# Patient Record
Sex: Female | Born: 1940 | Race: White | Hispanic: No | Marital: Married | Smoking: Former smoker
Health system: Southern US, Community
[De-identification: ages and names within clinical notes are randomized; demographics above are authoritative.]

## PROBLEM LIST (undated history)

## (undated) DIAGNOSIS — G2581 Restless legs syndrome: Secondary | ICD-10-CM

## (undated) DIAGNOSIS — K759 Inflammatory liver disease, unspecified: Secondary | ICD-10-CM

## (undated) DIAGNOSIS — R112 Nausea with vomiting, unspecified: Secondary | ICD-10-CM

## (undated) DIAGNOSIS — E039 Hypothyroidism, unspecified: Secondary | ICD-10-CM

## (undated) DIAGNOSIS — Z8639 Personal history of other endocrine, nutritional and metabolic disease: Secondary | ICD-10-CM

## (undated) DIAGNOSIS — C4491 Basal cell carcinoma of skin, unspecified: Secondary | ICD-10-CM

## (undated) DIAGNOSIS — J189 Pneumonia, unspecified organism: Secondary | ICD-10-CM

## (undated) DIAGNOSIS — E7800 Pure hypercholesterolemia, unspecified: Secondary | ICD-10-CM

## (undated) DIAGNOSIS — M5030 Other cervical disc degeneration, unspecified cervical region: Secondary | ICD-10-CM

## (undated) DIAGNOSIS — Z923 Personal history of irradiation: Secondary | ICD-10-CM

## (undated) DIAGNOSIS — M1990 Unspecified osteoarthritis, unspecified site: Secondary | ICD-10-CM

## (undated) DIAGNOSIS — R000 Tachycardia, unspecified: Secondary | ICD-10-CM

## (undated) DIAGNOSIS — Z8719 Personal history of other diseases of the digestive system: Secondary | ICD-10-CM

## (undated) DIAGNOSIS — K589 Irritable bowel syndrome without diarrhea: Secondary | ICD-10-CM

## (undated) DIAGNOSIS — I10 Essential (primary) hypertension: Secondary | ICD-10-CM

## (undated) DIAGNOSIS — Z862 Personal history of diseases of the blood and blood-forming organs and certain disorders involving the immune mechanism: Secondary | ICD-10-CM

## (undated) DIAGNOSIS — K219 Gastro-esophageal reflux disease without esophagitis: Secondary | ICD-10-CM

## (undated) DIAGNOSIS — F411 Generalized anxiety disorder: Secondary | ICD-10-CM

## (undated) DIAGNOSIS — Z9889 Other specified postprocedural states: Secondary | ICD-10-CM

## (undated) DIAGNOSIS — H269 Unspecified cataract: Secondary | ICD-10-CM

## (undated) HISTORY — PX: BREAST LUMPECTOMY: SHX2

## (undated) HISTORY — PX: UPPER GI ENDOSCOPY: SHX6162

## (undated) HISTORY — PX: TOTAL HIP ARTHROPLASTY: SHX124

## (undated) HISTORY — PX: CARPAL TUNNEL RELEASE: SHX101

## (undated) HISTORY — PX: COLONOSCOPY: SHX174

## (undated) HISTORY — PX: APPENDECTOMY: SHX54

## (undated) HISTORY — PX: INGUINAL HERNIA REPAIR: SUR1180

## (undated) HISTORY — PX: TOTAL KNEE ARTHROPLASTY: SHX125

---
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
## 1986-11-11 HISTORY — PX: ABDOMINAL HYSTERECTOMY: SHX81

## 2009-11-11 HISTORY — PX: BLADDER SUSPENSION: SHX72

## 2010-11-11 HISTORY — PX: JOINT REPLACEMENT: SHX530

## 2012-12-25 ENCOUNTER — Encounter (HOSPITAL_COMMUNITY): Payer: Self-pay | Admitting: Pharmacy Technician

## 2012-12-31 ENCOUNTER — Encounter (HOSPITAL_COMMUNITY)
Admission: RE | Admit: 2012-12-31 | Discharge: 2012-12-31 | Disposition: A | Discharge status: Home / Self Care | Payer: Medicare Other | Source: Ambulatory Visit | Attending: Orthopedic Surgery | Admitting: Orthopedic Surgery

## 2012-12-31 ENCOUNTER — Ambulatory Visit (HOSPITAL_COMMUNITY)
Admission: RE | Admit: 2012-12-31 | Discharge: 2012-12-31 | Disposition: A | Discharge status: Home / Self Care | Payer: Medicare Other | Source: Ambulatory Visit | Attending: Orthopedic Surgery | Admitting: Orthopedic Surgery

## 2012-12-31 ENCOUNTER — Encounter (HOSPITAL_COMMUNITY): Payer: Self-pay

## 2012-12-31 ENCOUNTER — Other Ambulatory Visit: Payer: Self-pay | Admitting: Orthopedic Surgery

## 2012-12-31 DIAGNOSIS — Z01818 Encounter for other preprocedural examination: Secondary | ICD-10-CM | POA: Insufficient documentation

## 2012-12-31 DIAGNOSIS — R9431 Abnormal electrocardiogram [ECG] [EKG]: Secondary | ICD-10-CM | POA: Insufficient documentation

## 2012-12-31 DIAGNOSIS — Z01810 Encounter for preprocedural cardiovascular examination: Secondary | ICD-10-CM | POA: Insufficient documentation

## 2012-12-31 DIAGNOSIS — Z01812 Encounter for preprocedural laboratory examination: Secondary | ICD-10-CM | POA: Insufficient documentation

## 2012-12-31 HISTORY — DX: Essential (primary) hypertension: I10

## 2012-12-31 HISTORY — DX: Hypothyroidism, unspecified: E03.9

## 2012-12-31 HISTORY — DX: Unspecified osteoarthritis, unspecified site: M19.90

## 2012-12-31 LAB — ABO/RH: ABO/RH(D): O POS

## 2012-12-31 LAB — CBC WITH DIFFERENTIAL/PLATELET
Basophils Absolute: 0 10*3/uL (ref 0.0–0.1)
Basophils Relative: 1 % (ref 0–1)
Eosinophils Absolute: 0 10*3/uL (ref 0.0–0.7)
Eosinophils Relative: 1 % (ref 0–5)
Lymphs Abs: 1.1 10*3/uL (ref 0.7–4.0)
MCH: 29.6 pg (ref 26.0–34.0)
MCHC: 33.7 g/dL (ref 30.0–36.0)
MCV: 88 fL (ref 78.0–100.0)
Neutrophils Relative %: 61 % (ref 43–77)
Platelets: 218 10*3/uL (ref 150–400)
RDW: 12.3 % (ref 11.5–15.5)

## 2012-12-31 LAB — URINALYSIS, ROUTINE W REFLEX MICROSCOPIC
Glucose, UA: NEGATIVE mg/dL
Leukocytes, UA: NEGATIVE
Protein, ur: NEGATIVE mg/dL
Specific Gravity, Urine: 1.023 (ref 1.005–1.030)
Urobilinogen, UA: 0.2 mg/dL (ref 0.0–1.0)

## 2012-12-31 LAB — TYPE AND SCREEN
ABO/RH(D): O POS
Antibody Screen: NEGATIVE

## 2012-12-31 LAB — URINE MICROSCOPIC-ADD ON

## 2012-12-31 LAB — COMPREHENSIVE METABOLIC PANEL
ALT: 14 U/L (ref 0–35)
Albumin: 3.7 g/dL (ref 3.5–5.2)
Calcium: 9.7 mg/dL (ref 8.4–10.5)
GFR calc Af Amer: >90 mL/min (ref >90)
Glucose, Bld: 87 mg/dL (ref 70–99)
Sodium: 139 mEq/L (ref 135–145)
Total Protein: 7 g/dL (ref 6.0–8.3)

## 2012-12-31 LAB — SURGICAL PCR SCREEN
MRSA, PCR: NEGATIVE
Staphylococcus aureus: NEGATIVE

## 2012-12-31 MED ORDER — CHLORHEXIDINE GLUCONATE 4 % EX LIQD: 60.0000 mL | Freq: Once | CUTANEOUS | Status: DC

## 2012-12-31 NOTE — Pre-Procedure Instructions (Signed)
Anne Pearson  12/31/2012   Your procedure is scheduled on:  Monday January 04, 2013  Report to [facility] at 0530 AM.  Call this number if you have problems the morning of surgery: [phone]   Remember:   Do not eat food or drink liquids after midnight.Sunday   Take these medicines the morning of surgery with A SIP OF WATER: Alprazolam (Xanax) if needed, Synthroid (Levothyroxine), Loratadine (Claritin), Tramadol ( Ultram ) if needed. Bring nasal spray.   Do not wear jewelry, make-up or nail polish.  Do not wear lotions, powders, or perfumes. You may wear deodorant.  Do not shave 48 hours prior to surgery.   Do not bring valuables to the hospital.  Contacts, dentures or bridgework may not be worn into surgery.  Leave suitcase in the car. After surgery it may be brought to your room.  For patients admitted to the hospital, checkout time is 11:00 AM the day of  discharge.   Patients discharged the day of surgery will not be allowed to drive  home.    Special Instructions: Incentive Spirometry - Practice and bring it with you on the day of surgery. Shower using CHG 2 nights before surgery and the night before surgery.  If you shower the day of surgery use CHG.  Use special wash - you have one bottle of CHG for all showers.  You should use approximately 1/3 of the bottle for each shower.   Please read over the following fact sheets that you were given: Pain Booklet, Coughing and Deep Breathing, Blood Transfusion Information, MRSA Information and Surgical Site Infection Prevention

## 2013-01-01 LAB — URINE CULTURE

## 2013-01-03 MED ORDER — CEFAZOLIN SODIUM-DEXTROSE 2-3 GM-% IV SOLR
2.0000 g | INTRAVENOUS | Status: AC
  Administered 2013-01-04: 2 g via INTRAVENOUS
  Filled 2013-01-03: qty 50

## 2013-01-04 ENCOUNTER — Inpatient Hospital Stay (HOSPITAL_COMMUNITY): Payer: Medicare Other | Admitting: Anesthesiology

## 2013-01-04 ENCOUNTER — Inpatient Hospital Stay (HOSPITAL_COMMUNITY)
Admission: RE | Admit: 2013-01-04 | Discharge: 2013-01-06 | DRG: 470 | Disposition: A | Discharge status: Home Health | Payer: Medicare Other | Source: Ambulatory Visit | Attending: Orthopedic Surgery | Admitting: Orthopedic Surgery

## 2013-01-04 ENCOUNTER — Encounter (HOSPITAL_COMMUNITY): Payer: Self-pay | Admitting: *Deleted

## 2013-01-04 ENCOUNTER — Encounter (HOSPITAL_COMMUNITY): Payer: Self-pay | Admitting: Anesthesiology

## 2013-01-04 ENCOUNTER — Inpatient Hospital Stay (HOSPITAL_COMMUNITY): Payer: Medicare Other

## 2013-01-04 ENCOUNTER — Encounter (HOSPITAL_COMMUNITY)
Admission: RE | Disposition: A | Discharge status: Home Health | Payer: Self-pay | Source: Ambulatory Visit | Attending: Orthopedic Surgery

## 2013-01-04 DIAGNOSIS — Z87891 Personal history of nicotine dependence: Secondary | ICD-10-CM

## 2013-01-04 DIAGNOSIS — D62 Acute posthemorrhagic anemia: Secondary | ICD-10-CM | POA: Diagnosis not present

## 2013-01-04 DIAGNOSIS — I10 Essential (primary) hypertension: Secondary | ICD-10-CM | POA: Diagnosis present

## 2013-01-04 DIAGNOSIS — E039 Hypothyroidism, unspecified: Secondary | ICD-10-CM | POA: Diagnosis present

## 2013-01-04 DIAGNOSIS — M1710 Unilateral primary osteoarthritis, unspecified knee: Principal | ICD-10-CM | POA: Diagnosis present

## 2013-01-04 HISTORY — PX: PARTIAL KNEE ARTHROPLASTY: SHX2174

## 2013-01-04 LAB — CBC
MCH: 29.9 pg (ref 26.0–34.0)
MCV: 87.4 fL (ref 78.0–100.0)
Platelets: 184 10*3/uL (ref 150–400)
RBC: 4.28 MIL/uL (ref 3.87–5.11)
RDW: 12.2 % (ref 11.5–15.5)

## 2013-01-04 LAB — CREATININE, SERUM: Creatinine, Ser: 0.78 mg/dL (ref 0.50–1.10)

## 2013-01-04 SURGERY — ARTHROPLASTY, KNEE, UNICOMPARTMENTAL
Anesthesia: Regional | Site: Knee | Laterality: Right | Wound class: Clean

## 2013-01-04 MED ORDER — METOCLOPRAMIDE HCL 10 MG PO TABS: 5.0000 mg | ORAL_TABLET | Freq: Three times a day (TID) | ORAL | Status: DC | PRN

## 2013-01-04 MED ORDER — HYDROMORPHONE HCL PF 1 MG/ML IJ SOLN
INTRAMUSCULAR | Status: AC
  Filled 2013-01-04: qty 1

## 2013-01-04 MED ORDER — BISACODYL 5 MG PO TBEC: 5.0000 mg | DELAYED_RELEASE_TABLET | Freq: Every day | ORAL | Status: DC | PRN

## 2013-01-04 MED ORDER — OXYCODONE HCL 5 MG PO TABS
5.0000 mg | ORAL_TABLET | ORAL | Status: DC | PRN
  Administered 2013-01-04: 5 mg via ORAL
  Administered 2013-01-05 – 2013-01-06 (×5): 10 mg via ORAL
  Filled 2013-01-04 (×6): qty 2
  Filled 2013-01-04: qty 1

## 2013-01-04 MED ORDER — ONDANSETRON HCL 4 MG/2ML IJ SOLN: 4.0000 mg | Freq: Once | INTRAMUSCULAR | Status: DC | PRN

## 2013-01-04 MED ORDER — ACETAMINOPHEN 10 MG/ML IV SOLN
INTRAVENOUS | Status: AC
  Filled 2013-01-04: qty 100

## 2013-01-04 MED ORDER — GLYCOPYRROLATE 0.2 MG/ML IJ SOLN
INTRAMUSCULAR | Status: DC | PRN
  Administered 2013-01-04: 0.2 mg via INTRAVENOUS

## 2013-01-04 MED ORDER — SODIUM CHLORIDE 0.9 % IV SOLN: INTRAVENOUS | Status: DC

## 2013-01-04 MED ORDER — LACTATED RINGERS IV SOLN
INTRAVENOUS | Status: DC | PRN
  Administered 2013-01-04 (×2): via INTRAVENOUS

## 2013-01-04 MED ORDER — FLUTICASONE PROPIONATE 50 MCG/ACT NA SUSP
2.0000 | Freq: Every day | NASAL | Status: DC
  Administered 2013-01-04 – 2013-01-06 (×3): 2 via NASAL
  Filled 2013-01-04: qty 16

## 2013-01-04 MED ORDER — HYDROMORPHONE HCL PF 1 MG/ML IJ SOLN: 0.5000 mg | INTRAMUSCULAR | Status: DC | PRN

## 2013-01-04 MED ORDER — METHOCARBAMOL 500 MG PO TABS
ORAL_TABLET | ORAL | Status: AC
  Filled 2013-01-04: qty 1

## 2013-01-04 MED ORDER — HYDROMORPHONE HCL PF 1 MG/ML IJ SOLN
0.2500 mg | INTRAMUSCULAR | Status: DC | PRN
  Administered 2013-01-04 (×2): 0.5 mg via INTRAVENOUS

## 2013-01-04 MED ORDER — FENTANYL CITRATE 0.05 MG/ML IJ SOLN
INTRAMUSCULAR | Status: DC | PRN
  Administered 2013-01-04 (×4): 50 ug via INTRAVENOUS

## 2013-01-04 MED ORDER — DOCUSATE SODIUM 100 MG PO CAPS
100.0000 mg | ORAL_CAPSULE | Freq: Two times a day (BID) | ORAL | Status: DC
  Administered 2013-01-04 – 2013-01-06 (×5): 100 mg via ORAL
  Filled 2013-01-04 (×5): qty 1

## 2013-01-04 MED ORDER — DEXTROSE 5 % IV SOLN
INTRAVENOUS | Status: DC | PRN
  Administered 2013-01-04 (×2): via INTRAVENOUS

## 2013-01-04 MED ORDER — ALUM & MAG HYDROXIDE-SIMETH 200-200-20 MG/5ML PO SUSP: 30.0000 mL | ORAL | Status: DC | PRN

## 2013-01-04 MED ORDER — SENNOSIDES-DOCUSATE SODIUM 8.6-50 MG PO TABS: 1.0000 | ORAL_TABLET | Freq: Every evening | ORAL | Status: DC | PRN

## 2013-01-04 MED ORDER — ACETAMINOPHEN 650 MG RE SUPP: 650.0000 mg | Freq: Four times a day (QID) | RECTAL | Status: DC | PRN

## 2013-01-04 MED ORDER — PROPOFOL 10 MG/ML IV EMUL
INTRAVENOUS | Status: DC | PRN
  Administered 2013-01-04: 200 mL via INTRAVENOUS

## 2013-01-04 MED ORDER — MIDAZOLAM HCL 5 MG/5ML IJ SOLN
INTRAMUSCULAR | Status: DC | PRN
  Administered 2013-01-04 (×2): 1 mg via INTRAVENOUS

## 2013-01-04 MED ORDER — BUPIVACAINE-EPINEPHRINE 0.25% -1:200000 IJ SOLN
INTRAMUSCULAR | Status: AC
  Filled 2013-01-04: qty 1

## 2013-01-04 MED ORDER — ALPRAZOLAM 0.5 MG PO TABS
0.5000 mg | ORAL_TABLET | Freq: Every day | ORAL | Status: DC
  Administered 2013-01-04 – 2013-01-05 (×2): 0.5 mg via ORAL
  Filled 2013-01-04 (×2): qty 1

## 2013-01-04 MED ORDER — FLEET ENEMA 7-19 GM/118ML RE ENEM: 1.0000 | ENEMA | Freq: Once | RECTAL | Status: AC | PRN

## 2013-01-04 MED ORDER — DEXAMETHASONE SODIUM PHOSPHATE 10 MG/ML IJ SOLN
INTRAMUSCULAR | Status: DC | PRN
  Administered 2013-01-04: 8 mg via INTRAVENOUS

## 2013-01-04 MED ORDER — LIDOCAINE HCL (CARDIAC) 20 MG/ML IV SOLN
INTRAVENOUS | Status: DC | PRN
  Administered 2013-01-04: 100 mg via INTRAVENOUS

## 2013-01-04 MED ORDER — LEVOTHYROXINE SODIUM 112 MCG PO TABS
112.0000 ug | ORAL_TABLET | Freq: Every day | ORAL | Status: DC
  Administered 2013-01-05 – 2013-01-06 (×2): 112 ug via ORAL
  Filled 2013-01-04 (×3): qty 1

## 2013-01-04 MED ORDER — CHLORHEXIDINE GLUCONATE 4 % EX LIQD: 60.0000 mL | Freq: Once | CUTANEOUS | Status: DC

## 2013-01-04 MED ORDER — ACETAMINOPHEN 325 MG PO TABS: 650.0000 mg | ORAL_TABLET | Freq: Four times a day (QID) | ORAL | Status: DC | PRN

## 2013-01-04 MED ORDER — METHOCARBAMOL 500 MG PO TABS
500.0000 mg | ORAL_TABLET | Freq: Four times a day (QID) | ORAL | Status: DC | PRN
  Administered 2013-01-04 – 2013-01-06 (×4): 500 mg via ORAL
  Filled 2013-01-04 (×4): qty 1

## 2013-01-04 MED ORDER — ZOLPIDEM TARTRATE 5 MG PO TABS: 5.0000 mg | ORAL_TABLET | Freq: Every evening | ORAL | Status: DC | PRN

## 2013-01-04 MED ORDER — ONDANSETRON HCL 4 MG/2ML IJ SOLN: 4.0000 mg | Freq: Four times a day (QID) | INTRAMUSCULAR | Status: DC | PRN

## 2013-01-04 MED ORDER — SODIUM CHLORIDE 0.9 % IV SOLN
INTRAVENOUS | Status: DC
  Administered 2013-01-04: 10:00:00 via INTRAVENOUS

## 2013-01-04 MED ORDER — METOCLOPRAMIDE HCL 5 MG/ML IJ SOLN: 5.0000 mg | Freq: Three times a day (TID) | INTRAMUSCULAR | Status: DC | PRN

## 2013-01-04 MED ORDER — ACETAMINOPHEN 10 MG/ML IV SOLN
1000.0000 mg | Freq: Four times a day (QID) | INTRAVENOUS | Status: AC
  Administered 2013-01-04 – 2013-01-05 (×3): 1000 mg via INTRAVENOUS
  Filled 2013-01-04 (×5): qty 100

## 2013-01-04 MED ORDER — CELECOXIB 200 MG PO CAPS
200.0000 mg | ORAL_CAPSULE | Freq: Two times a day (BID) | ORAL | Status: DC
  Administered 2013-01-04 – 2013-01-06 (×5): 200 mg via ORAL
  Filled 2013-01-04 (×6): qty 1

## 2013-01-04 MED ORDER — CEFAZOLIN SODIUM 1-5 GM-% IV SOLN
1.0000 g | Freq: Four times a day (QID) | INTRAVENOUS | Status: AC
  Administered 2013-01-04 (×2): 1 g via INTRAVENOUS
  Filled 2013-01-04 (×3): qty 50

## 2013-01-04 MED ORDER — METHOCARBAMOL 100 MG/ML IJ SOLN: 500.0000 mg | Freq: Four times a day (QID) | INTRAVENOUS | Status: DC | PRN

## 2013-01-04 MED ORDER — PHENOL 1.4 % MT LIQD: 1.0000 | OROMUCOSAL | Status: DC | PRN

## 2013-01-04 MED ORDER — ONDANSETRON HCL 4 MG PO TABS: 4.0000 mg | ORAL_TABLET | Freq: Four times a day (QID) | ORAL | Status: DC | PRN

## 2013-01-04 MED ORDER — DIPHENHYDRAMINE HCL 12.5 MG/5ML PO ELIX: 12.5000 mg | ORAL_SOLUTION | ORAL | Status: DC | PRN

## 2013-01-04 MED ORDER — MENTHOL 3 MG MT LOZG: 1.0000 | LOZENGE | OROMUCOSAL | Status: DC | PRN

## 2013-01-04 MED ORDER — BUPIVACAINE-EPINEPHRINE 0.25% -1:200000 IJ SOLN
INTRAMUSCULAR | Status: DC | PRN
  Administered 2013-01-04: 20 mL

## 2013-01-04 MED ORDER — LISINOPRIL 10 MG PO TABS
10.0000 mg | ORAL_TABLET | Freq: Every day | ORAL | Status: DC
  Administered 2013-01-05 – 2013-01-06 (×2): 10 mg via ORAL
  Filled 2013-01-04 (×2): qty 1

## 2013-01-04 MED ORDER — ACETAMINOPHEN 10 MG/ML IV SOLN
1000.0000 mg | Freq: Four times a day (QID) | INTRAVENOUS | Status: DC
  Administered 2013-01-04: 1000 mg via INTRAVENOUS

## 2013-01-04 MED ORDER — ENOXAPARIN SODIUM 30 MG/0.3ML ~~LOC~~ SOLN
30.0000 mg | Freq: Two times a day (BID) | SUBCUTANEOUS | Status: DC
  Administered 2013-01-05 – 2013-01-06 (×3): 30 mg via SUBCUTANEOUS
  Filled 2013-01-04 (×5): qty 0.3

## 2013-01-04 MED ORDER — LORATADINE 10 MG PO TABS
10.0000 mg | ORAL_TABLET | Freq: Every day | ORAL | Status: DC
  Administered 2013-01-04 – 2013-01-06 (×3): 10 mg via ORAL
  Filled 2013-01-04 (×3): qty 1

## 2013-01-04 MED ORDER — ONDANSETRON HCL 4 MG/2ML IJ SOLN
INTRAMUSCULAR | Status: DC | PRN
  Administered 2013-01-04: 4 mg via INTRAVENOUS

## 2013-01-04 MED ORDER — PHENYLEPHRINE HCL 10 MG/ML IJ SOLN
INTRAMUSCULAR | Status: DC | PRN
  Administered 2013-01-04: 40 ug via INTRAVENOUS
  Administered 2013-01-04: 120 ug via INTRAVENOUS

## 2013-01-04 SURGICAL SUPPLY — 61 items
BANDAGE ESMARK 6X9 LF (GAUZE/BANDAGES/DRESSINGS) ×1 IMPLANT
BLADE SAW RECIP 87.9 MT (BLADE) ×2 IMPLANT
BLADE SAW SGTL 13X75X1.27 (BLADE) ×2 IMPLANT
BLADE SAW SGTL 83.5X18.5 (BLADE) ×2 IMPLANT
BNDG ELASTIC 6X10 VLCR STRL LF (GAUZE/BANDAGES/DRESSINGS) ×2 IMPLANT
BNDG ESMARK 6X9 LF (GAUZE/BANDAGES/DRESSINGS) ×2
BOWL SMART MIX CTS (DISPOSABLE) ×2 IMPLANT
CEMENT BONE SIMPLEX SPEEDSET (Cement) ×2 IMPLANT
CLOTH BEACON ORANGE TIMEOUT ST (SAFETY) ×2 IMPLANT
COTTONBALL LRG STERILE PKG (GAUZE/BANDAGES/DRESSINGS) ×2 IMPLANT
COVER BACK TABLE 24X17X13 BIG (DRAPES) IMPLANT
COVER SURGICAL LIGHT HANDLE (MISCELLANEOUS) ×4 IMPLANT
CUFF TOURNIQUET SINGLE 34IN LL (TOURNIQUET CUFF) ×2 IMPLANT
DRAPE C-ARM 42X72 X-RAY (DRAPES) ×2 IMPLANT
DRAPE EXTREMITY T 121X128X90 (DRAPE) ×2 IMPLANT
DRAPE INCISE IOBAN 66X45 STRL (DRAPES) ×6 IMPLANT
DRAPE PROXIMA HALF (DRAPES) ×2 IMPLANT
DRAPE U-SHAPE 47X51 STRL (DRAPES) ×2 IMPLANT
DRSG ADAPTIC 3X8 NADH LF (GAUZE/BANDAGES/DRESSINGS) ×2 IMPLANT
DRSG PAD ABDOMINAL 8X10 ST (GAUZE/BANDAGES/DRESSINGS) ×2 IMPLANT
DURAPREP 26ML APPLICATOR (WOUND CARE) ×4 IMPLANT
ELECT REM PT RETURN 9FT ADLT (ELECTROSURGICAL) ×2
ELECTRODE REM PT RTRN 9FT ADLT (ELECTROSURGICAL) ×1 IMPLANT
EVACUATOR 1/8 PVC DRAIN (DRAIN) ×2 IMPLANT
FLUID NSS /IRRIG 3000 ML XXX (IV SOLUTION) ×2 IMPLANT
GLOVE BIOGEL M 7.0 STRL (GLOVE) ×2 IMPLANT
GLOVE BIOGEL PI IND STRL 7.5 (GLOVE) ×1 IMPLANT
GLOVE BIOGEL PI IND STRL 8.5 (GLOVE) ×2 IMPLANT
GLOVE BIOGEL PI INDICATOR 7.5 (GLOVE) ×1
GLOVE BIOGEL PI INDICATOR 8.5 (GLOVE) ×2
GLOVE SURG ORTHO 8.0 STRL STRW (GLOVE) ×4 IMPLANT
GOWN PREVENTION PLUS XLARGE (GOWN DISPOSABLE) ×4 IMPLANT
GOWN STRL NON-REIN LRG LVL3 (GOWN DISPOSABLE) ×6 IMPLANT
HANDPIECE INTERPULSE COAX TIP (DISPOSABLE) ×1
HOOD PEEL AWAY FACE SHEILD DIS (HOOD) ×6 IMPLANT
KIT BASIN OR (CUSTOM PROCEDURE TRAY) ×2 IMPLANT
KIT ROOM TURNOVER OR (KITS) ×2 IMPLANT
MANIFOLD NEPTUNE II (INSTRUMENTS) ×2 IMPLANT
NEEDLE 22X1 1/2 (OR ONLY) (NEEDLE) ×2 IMPLANT
NS IRRIG 1000ML POUR BTL (IV SOLUTION) ×2 IMPLANT
PACK TOTAL JOINT (CUSTOM PROCEDURE TRAY) ×2 IMPLANT
PAD ARMBOARD 7.5X6 YLW CONV (MISCELLANEOUS) ×4 IMPLANT
PADDING CAST COTTON 6X4 STRL (CAST SUPPLIES) ×2 IMPLANT
SCREW HEADED 33MM (Screw) ×6 IMPLANT
SET HNDPC FAN SPRY TIP SCT (DISPOSABLE) ×1 IMPLANT
SPONGE GAUZE 4X4 12PLY (GAUZE/BANDAGES/DRESSINGS) ×2 IMPLANT
STAPLER VISISTAT 35W (STAPLE) ×2 IMPLANT
SUCTION FRAZIER TIP 10 FR DISP (SUCTIONS) ×2 IMPLANT
SUT BONE WAX W31G (SUTURE) ×2 IMPLANT
SUT VIC AB 0 CT1 27 (SUTURE) ×2
SUT VIC AB 0 CT1 27XBRD ANBCTR (SUTURE) ×2 IMPLANT
SUT VIC AB 1 CT1 27 (SUTURE) ×4
SUT VIC AB 1 CT1 27XBRD ANBCTR (SUTURE) ×4 IMPLANT
SUT VIC AB 2-0 CT1 27 (SUTURE) ×1
SUT VIC AB 2-0 CT1 TAPERPNT 27 (SUTURE) ×1 IMPLANT
SYR CONTROL 10ML LL (SYRINGE) ×2 IMPLANT
TAPE CLOTH SURG 6X10 WHT LF (GAUZE/BANDAGES/DRESSINGS) ×2 IMPLANT
TOWEL OR 17X24 6PK STRL BLUE (TOWEL DISPOSABLE) ×2 IMPLANT
TOWEL OR 17X26 10 PK STRL BLUE (TOWEL DISPOSABLE) ×2 IMPLANT
TRAY TIBIAL INSERTER TIP (Orthopedic Implant) ×2 IMPLANT
WATER STERILE IRR 1000ML POUR (IV SOLUTION) ×6 IMPLANT

## 2013-01-04 NOTE — Anesthesia Postprocedure Evaluation (Signed)
  Anesthesia Post-op Note  Patient: Anne Pearson  Procedure(s) Performed: Procedure(s): PARTIAL KNEE REPLACEMENT/UNICOMPARTMENTAL MEDIAL (Right)  Patient Location: PACU  Anesthesia Type:General  Level of Consciousness: awake, oriented and patient cooperative  Airway and Oxygen Therapy: Patient Spontanous Breathing  Post-op Pain: mild  Post-op Assessment: Post-op Vital signs reviewed, Patient's Cardiovascular Status Stable, Respiratory Function Stable, Patent Airway, No signs of Nausea or vomiting and Pain level controlled  Post-op Vital Signs: stable  Complications: No apparent anesthesia complications

## 2013-01-04 NOTE — Op Note (Signed)
Dictation Number:  [id number]

## 2013-01-04 NOTE — Preoperative (Signed)
Beta Blockers   Reason not to administer Beta Blockers:Not Applicable 

## 2013-01-04 NOTE — Transfer of Care (Signed)
Immediate Anesthesia Transfer of Care Note  Patient: Anne Pearson  Procedure(s) Performed: Procedure(s): PARTIAL KNEE REPLACEMENT/UNICOMPARTMENTAL MEDIAL (Right)  Patient Location: PACU  Anesthesia Type:General  Level of Consciousness: awake, alert , oriented and patient cooperative  Airway & Oxygen Therapy: Patient Spontanous Breathing and Patient connected to nasal cannula oxygen  Post-op Assessment: Report given to PACU RN and Post -op Vital signs reviewed and stable  Post vital signs: Reviewed and stable  Complications: No apparent anesthesia complications

## 2013-01-04 NOTE — Evaluation (Signed)
Physical Therapy Evaluation Patient Details Name: Anne Pearson MRN: [id number] DOB: February 24, 1941 Today's Date: 01/04/2013 Time: 0865-7846 PT Time Calculation (min): 21 min  PT Assessment / Plan / Recommendation Clinical Impression  Patient is a 72 yo female s/p Rt. knee unicompartmental arthroplasty.  Will benefit from acute PT to maximize independence prior to return home with husband.  Recommend HHPT at discharge for continued therapy.    PT Assessment  Patient needs continued PT services    Follow Up Recommendations  Home health PT;Supervision/Assistance - 24 hour    Does the patient have the potential to tolerate intense rehabilitation      Barriers to Discharge None      Equipment Recommendations  Other (comment) (Patient requesting shower seat - defer to OT)    Recommendations for Other Services     Frequency 7X/week    Precautions / Restrictions Precautions Precautions: Knee Precaution Booklet Issued: Yes (comment) Precaution Comments: Reviewed precautions with patient Restrictions Weight Bearing Restrictions: Yes RLE Weight Bearing: Weight bearing as tolerated   Pertinent Vitals/Pain       Mobility  Bed Mobility Bed Mobility: Supine to Sit;Sitting - Scoot to Edge of Bed Supine to Sit: 4: Min assist;HOB flat Sitting - Scoot to Edge of Bed: 4: Min guard;With rail Details for Bed Mobility Assistance: Verbal cues for technique.  Assist to move RLE off of bed.  Patient sat at EOB x 7 minutes with good balance. Transfers Transfers: Sit to Stand;Stand to [employer] Transfers Sit to Stand: 3: Mod assist;With upper extremity assist;From bed Stand to Sit: 4: Min assist;With upper extremity assist;With armrests;To chair/3-in-1 Stand Pivot Transfers: 3: Mod assist Details for Transfer Assistance: Verbal cues for hand placement and technique.  Mod assist to rise from bed - encouraged patient to push with LLE and UE's to lift up to standing.  Patient then able to  take several steps to pivot to chair.  Assist to control descent into chair. Ambulation/Gait Ambulation/Gait Assistance: Not tested (comment)    Exercises Total Joint Exercises Ankle Circles/Pumps: AROM;Both;10 reps;Seated   PT Diagnosis: Difficulty walking;Acute pain  PT Problem List: Decreased strength;Decreased range of motion;Decreased activity tolerance;Decreased balance;Decreased mobility;Decreased knowledge of use of DME;Decreased knowledge of precautions;Pain PT Treatment Interventions: DME instruction;Gait training;Stair training;Functional mobility training;Therapeutic exercise;Patient/family education   PT Goals Acute Rehab PT Goals PT Goal Formulation: With patient Time For Goal Achievement: 01/11/13 Potential to Achieve Goals: Good Pt will go Supine/Side to Sit: Independently;with HOB 0 degrees PT Goal: Supine/Side to Sit - Progress: Goal set today Pt will go Sit to Supine/Side: Independently;with HOB 0 degrees PT Goal: Sit to Supine/Side - Progress: Goal set today Pt will go Sit to Stand: with supervision;with upper extremity assist PT Goal: Sit to Stand - Progress: Goal set today Pt will Ambulate: 51 - 150 feet;with supervision;with rolling walker PT Goal: Ambulate - Progress: Goal set today Pt will Go Up / Down Stairs: 1-2 stairs;with min assist;with least restrictive assistive device PT Goal: Up/Down Stairs - Progress: Goal set today Pt will Perform Home Exercise Program: with supervision, verbal cues required/provided PT Goal: Perform Home Exercise Program - Progress: Goal set today  Visit Information  Last PT Received On: 01/04/13 Assistance Needed: +1    Subjective Data  Subjective: "My leg does feel unsteady."  Patient Stated Goal: To return home soon   Prior Functioning  Home Living Lives With: Spouse Available Help at Discharge: Family;Available 24 hours/day Type of Home: House Home Access: Stairs to enter [employer] of  Steps: 1 Entrance  Stairs-Rails: None Home Layout: One level Bathroom Shower/Tub: Health visitor: Handicapped height Bathroom Accessibility: Yes How Accessible: Accessible via walker Home Adaptive Equipment: Walker - rolling Additional Comments: Patient states she would like a shower seat Prior Function Level of Independence: Independent Able to Take Stairs?: Yes Driving: Yes Vocation: Retired Musician: No difficulties    Copywriter, advertising Overall Cognitive Status: Appears within functional limits for tasks assessed/performed Arousal/Alertness: Awake/alert Orientation Level: Appears intact for tasks assessed Behavior During Session: [facility] for tasks performed    Extremity/Trunk Assessment Right Upper Extremity Assessment RUE ROM/Strength/Tone: WFL for tasks assessed RUE Sensation: WFL - Light Touch Left Upper Extremity Assessment LUE ROM/Strength/Tone: WFL for tasks assessed LUE Sensation: WFL - Light Touch Right Lower Extremity Assessment RLE ROM/Strength/Tone: Deficits;Unable to fully assess;Due to pain RLE ROM/Strength/Tone Deficits: Able to assist moving RLE off of bed. Left Lower Extremity Assessment LLE ROM/Strength/Tone: WFL for tasks assessed LLE Sensation: WFL - Light Touch Trunk Assessment Trunk Assessment: Normal   Balance Balance Balance Assessed: Yes Static Sitting Balance Static Sitting - Balance Support: No upper extremity supported;Feet supported Static Sitting - Level of Assistance: 5: Stand by assistance Static Sitting - Comment/# of Minutes: 7  End of Session PT - End of Session Equipment Utilized During Treatment: Gait belt Activity Tolerance: Patient limited by pain;Patient limited by fatigue Patient left: in chair;with call bell/phone within reach Nurse Communication: Mobility status CPM Right Knee CPM Right Knee: Off   GP     Vena Austria 01/04/2013, 4:03 PM Durenda Hurt. Renaldo Fiddler, [facility] Acute Rehab Services Pager  [phone]

## 2013-01-04 NOTE — H&P (Signed)
Anne Pearson MRN:  [id number] DOB/SEX:  1941-09-05/female  CHIEF COMPLAINT:  Painful right Knee  HISTORY: Patient is a 72 y.o. female presented with a history of pain in the right knee. Onset of symptoms was gradual starting several years ago with gradually worsening course since that time. Prior procedures on the knee include arthroscopy. Patient has been treated conservatively with over-the-counter NSAIDs and activity modification. Patient currently rates pain in the knee at several out of 10 with activity. There is no pain at night.  PAST MEDICAL HISTORY: There are no active problems to display for this patient.  Past Medical History  Diagnosis Date  . Hypertension   . Hypothyroidism   . Arthritis    Past Surgical History  Procedure Laterality Date  . Abdominal hysterectomy  1988  . Tonsillectomy  1947  . Bladder suspension  2011  . Joint replacement Left 2012     MEDICATIONS:   Prescriptions prior to admission  Medication Sig Dispense Refill  . ALPRAZolam (XANAX) 0.5 MG tablet Take 0.5 mg by mouth at bedtime.      Marland Kitchen CALCIUM-VITAMIN D PO Take 1 tablet by mouth daily.      . Cholecalciferol (VITAMIN D) 2000 UNITS CAPS Take 1 capsule by mouth daily.      . fluticasone (FLONASE) 50 MCG/ACT nasal spray Place 2 sprays into the nose daily.      Marland Kitchen levothyroxine (SYNTHROID, LEVOTHROID) 112 MCG tablet Take 112 mcg by mouth daily.      Marland Kitchen lisinopril (PRINIVIL,ZESTRIL) 10 MG tablet Take 10 mg by mouth daily.      Marland Kitchen loratadine (CLARITIN) 10 MG tablet Take 10 mg by mouth daily.      . traMADol (ULTRAM) 50 MG tablet Take 100 mg by mouth daily as needed for pain. For pain        ALLERGIES:   Allergies  Allergen Reactions  . Macrodantin (Nitrofurantoin) Other (See Comments)    Caused hepatitis   . Codeine Nausea And Vomiting  . Meloxicam Rash    REVIEW OF SYSTEMS:  Pertinent items are noted in HPI.   FAMILY HISTORY:  History reviewed. No pertinent family history.  SOCIAL  HISTORY:   History  Substance Use Topics  . Smoking status: Former Smoker -- 1.00 packs/day for 30 years    Types: Cigarettes  . Smokeless tobacco: Never Used  . Alcohol Use: Yes     Comment: social     EXAMINATION:  Vital signs in last 24 hours: Temp:  [97.9 F (36.6 C)] 97.9 F (36.6 C) (02/24 0600) Pulse Rate:  [68] 68 (02/24 0600) Resp:  [18] 18 (02/24 0600) BP: (106)/(69) 106/69 mmHg (02/24 0600) SpO2:  [98 %] 98 % (02/24 0600)  General appearance: alert, cooperative and no distress Lungs: clear to auscultation bilaterally Heart: regular rate and rhythm, S1, S2 normal, no murmur, click, rub or gallop Abdomen: soft, non-tender; bowel sounds normal; no masses,  no organomegaly Extremities: extremities normal, atraumatic, no cyanosis or edema and Homans sign is negative, no sign of DVT Pulses: 2+ and symmetric Skin: Skin color, texture, turgor normal. No rashes or lesions Neurologic: Alert and oriented X 3, normal strength and tone. Normal symmetric reflexes. Normal coordination and gait  Musculoskeletal:  ROM 0-120, Ligaments intact,  Imaging Review Plain radiographs demonstrate severe degenerative joint disease of the right knee. The overall alignment is neutral. The bone quality appears to be good for age and reported activity level.  Assessment/Plan: End stage arthritis, right knee, medial compartment  The patient history, physical examination and imaging studies are consistent with advanced degenerative joint disease of the right knee. The patient has failed conservative treatment.  The clearance notes were reviewed.  After discussion with the patient it was felt that Unicompartmental Knee Replacement was indicated. The procedure,  risks, and benefits of total knee arthroplasty were presented and reviewed. The risks including but not limited to aseptic loosening, infection, blood clots, vascular injury, stiffness, patella tracking problems complications among others were  discussed. The patient acknowledged the explanation, agreed to proceed with the plan.  Kwane Rohl 01/04/2013, 6:48 AM

## 2013-01-04 NOTE — Progress Notes (Signed)
Orthopedic Tech Progress Note Patient Details:  Anne Pearson June 27, 1941 [id number] CPM applied to Right LE with appropriate settings. OHF with trapeze applied to bed. CPM Right Knee CPM Right Knee: On Right Knee Flexion (Degrees): 90 Right Knee Extension (Degrees): 0   Asia R Thompson 01/04/2013, 12:05 PM

## 2013-01-04 NOTE — Anesthesia Preprocedure Evaluation (Addendum)
Anesthesia Evaluation  Patient identified by MRN, date of birth, ID band Patient awake    Reviewed: Allergy & Precautions, H&P , NPO status , Patient's Chart, lab work & pertinent test results  Airway Mallampati: I TM Distance: >3 FB Neck ROM: full    Dental  (+) Teeth Intact and Dental Advisory Given   Pulmonary neg pulmonary ROS, former smoker,  CHEST - 2 VIEW 12/31/12   Comparison: None.   Findings: The heart pulmonary vascularity are within normal limits. The lungs are well-aerated bilaterally without focal infiltrate. No acute bony abnormality is seen.   IMPRESSION: No acute abnormality noted.      Pulmonary exam normal       Cardiovascular Exercise Tolerance: Good hypertension, Pt. 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  Reproductive/Obstetrics                        Anesthesia Physical Anesthesia Plan  ASA: II  Anesthesia Plan: General   Post-op Pain Management: MAC Combined w/ Regional for Post-op pain   Induction: Intravenous  Airway Management Planned: Oral ETT and LMA  Additional Equipment:   Intra-op Plan:   Post-operative Plan: Extubation in OR  Informed Consent: I have reviewed the patients History and Physical, chart, labs and discussed the procedure including the risks, benefits and alternatives for the proposed anesthesia with the patient or authorized representative who has indicated his/her understanding and acceptance.   Dental advisory given  Plan Discussed with: CRNA, Anesthesiologist and Surgeon  Anesthesia  Plan Comments:        Anesthesia Quick Evaluation

## 2013-01-04 NOTE — Op Note (Signed)
NAMEMAKALAH, ASBERRY NO.:  [id number]  MEDICAL RECORD NO.:  [id number]  LOCATION:  5N11C                        FACILITY:  [facility]  PHYSICIAN:  Mila Homer. Sherlean Foot, M.D. DATE OF BIRTH:  Sep 10, 1941  DATE OF PROCEDURE:  01/04/2013 DATE OF DISCHARGE:                              OPERATIVE REPORT   SURGEON:  Mila Homer. Sherlean Foot, M.D.  ASSISTANT:  Altamese Cabal, PA-C  ANESTHESIA:  General.  PREOPERATIVE DIAGNOSIS:  Right knee medial compartment osteoarthritis.  POSTOPERATIVE DIAGNOSIS:  Right knee medial compartment osteoarthritis.  PROCEDURE:  Right knee arthroscopy with partial unicompartmental arthroplasty.  INDICATIONS FOR PROCEDURE:  The patient is a 72 year old white female with isolated medial compartment OA with failure of conservative measures.  Informed consent was obtained.  DESCRIPTION OF PROCEDURE:  The patient was laid supine and administered general anesthesia.  Right knee was prepped and draped in a sterile fashion.  The extremity was exsanguinated with an Esmarch.  Tourniquet was inflated to 350 mmHg and set for an hour.  Midline incision was made from the midpoint of the patella and the tibial tubercle.  Skin flaps were created to create a level window.  I then made an arthrotomy with a #10 blade and TED capsule and tagged them with #1 Vicryl to aid in exposure of the medial compartment.  I did remove some synovium in the area.  I then used extramedullary alignment system to make a perpendicular cut on the tibia and matched the distal femoral cut through the cutting guide.  I then completed those cuts with the sagittal saw and osteotome and rongeurs.  I then sized the femur to a size C in flexion and pinned the cutting block into place and made the chamfer and posterior condylar cuts and the lug holes.  I then tamped on a C-femur well and it was aligned perfectly.  I then templated the tibia to a 2 with the 2 cutting block into place, drilled with  the lugs and then trialed with an 11 spacer and had good flexion, extension and gap balance.  I then removed the trial components, copiously irrigated, mixed the cement, opened the part and cemented the femur and the tibia. I removed all excess cement, snapped in the polyethylene and allowed the cement to harden.  I then let the tourniquet down, which was at 40 minutes.  I then lavaged again, closed the arthrotomy with #1 Vicryl sutures and then buried with 0 Vicryl sutures in the deep soft tissue, subcuticular 2-0 Vicryl stitches, and skin staples.  Dressed with Xeroform, dressing sponges, sterile Webril, and Ace wrap.  COMPLICATIONS:  None.  DRAINS:  None.          ______________________________ Mila Homer. Sherlean Foot, M.D.     SDL/MEDQ  D:  01/04/2013  T:  01/04/2013  Job:  [id number]

## 2013-01-04 NOTE — Anesthesia Procedure Notes (Addendum)
Anesthesia Regional Block:  Femoral nerve block  Pre-Anesthetic Checklist: ,, timeout performed, Correct Patient, Correct Site, Correct Laterality, Correct Procedure, Correct Position, site marked, Risks and benefits discussed,  Surgical consent,  Pre-op evaluation,  At surgeon's request and post-op pain management  Laterality: Right  Prep: Maximum Sterile Barrier Precautions used, chloraprep and alcohol swabs       Needles:  Injection technique: Single-shot  Needle Type: Stimulator Needle - 80        Needle insertion depth: 5 cm   Additional Needles:  Procedures: nerve stimulator Femoral nerve block  Nerve Stimulator or Paresthesia:  Response: 5 mA, 1 ms, 5 cm  Additional Responses:   Narrative:  Start time: 01/04/2013 7:00 AM End time: 01/04/2013 7:05 AM Injection made incrementally with aspirations every 5 mL.  Performed by: Personally  Anesthesiologist: Maren Beach MD  Additional Notes: Pt accepts procedure and risks. 18 cc 0.5% Marcaine w/ epi w/o difficulty or discomfort. GES   Procedure Name: LMA Insertion Date/Time: 01/04/2013 7:31 AM Performed by: Tyrone Nine Pre-anesthesia Checklist: Patient identified, Timeout performed, Emergency Drugs available, Suction available and Patient being monitored Patient Re-evaluated:Patient Re-evaluated prior to inductionOxygen Delivery Method: Circle system utilized Preoxygenation: Pre-oxygenation with 100% oxygen Intubation Type: IV induction Ventilation: Mask ventilation without difficulty LMA: LMA with gastric port inserted LMA Size: 4.0 Number of attempts: 1 Placement Confirmation: positive ETCO2 and breath sounds checked- equal and bilateral Tube secured with: Tape Dental Injury: Teeth and Oropharynx as per pre-operative assessment

## 2013-01-05 ENCOUNTER — Encounter (HOSPITAL_COMMUNITY): Payer: Self-pay | Admitting: Orthopedic Surgery

## 2013-01-05 LAB — BASIC METABOLIC PANEL
CO2: 26 mEq/L (ref 19–32)
Calcium: 7.9 mg/dL — ABNORMAL LOW (ref 8.4–10.5)
Chloride: 106 mEq/L (ref 96–112)
Creatinine, Ser: 0.78 mg/dL (ref 0.50–1.10)
GFR calc Af Amer: >90 mL/min (ref >90)
GFR calc non Af Amer: 83 mL/min — ABNORMAL LOW (ref >90)
Potassium: 4.2 mEq/L (ref 3.5–5.1)
Sodium: 138 mEq/L (ref 135–145)

## 2013-01-05 LAB — CBC
HCT: 33.8 % — ABNORMAL LOW (ref 36.0–46.0)
MCH: 29.3 pg (ref 26.0–34.0)
MCHC: 33.1 g/dL (ref 30.0–36.0)
MCV: 88.5 fL (ref 78.0–100.0)
Platelets: 166 10*3/uL (ref 150–400)
RDW: 12.4 % (ref 11.5–15.5)

## 2013-01-05 MED ORDER — CELECOXIB 200 MG PO CAPS: 200.0000 mg | ORAL_CAPSULE | Freq: Two times a day (BID) | ORAL | Status: DC

## 2013-01-05 MED ORDER — METHOCARBAMOL 500 MG PO TABS: 500.0000 mg | ORAL_TABLET | Freq: Four times a day (QID) | ORAL | Status: DC | PRN

## 2013-01-05 MED ORDER — ENOXAPARIN SODIUM 40 MG/0.4ML ~~LOC~~ SOLN: 40.0000 mg | SUBCUTANEOUS | Status: DC

## 2013-01-05 MED ORDER — OXYCODONE HCL 5 MG PO TABS: 5.0000 mg | ORAL_TABLET | ORAL | Status: DC | PRN

## 2013-01-05 NOTE — Progress Notes (Signed)
Physical Therapy Treatment Patient Details Name: Anne Pearson MRN: [id number] DOB: [birth year] Today's Date: 01/05/2013 Time: 7829-5621 PT Time Calculation (min): 18 min  PT Assessment / Plan / Recommendation Comments on Treatment Session  pt presents with R Uniknee.  pt with improved mobility today, although still c/o some numbness in knee.      Follow Up Recommendations  Home health PT;Supervision/Assistance - 24 hour     Does the patient have the potential to tolerate intense rehabilitation     Barriers to Discharge        Equipment Recommendations   (Shower seat)    Recommendations for Other Services    Frequency 7X/week   Plan Discharge plan remains appropriate;Frequency remains appropriate    Precautions / Restrictions Precautions Precautions: Fall Restrictions Weight Bearing Restrictions: Yes RLE Weight Bearing: Weight bearing as tolerated   Pertinent Vitals/Pain Indicates minimal pain, but does c/o numbness.      Mobility  Bed Mobility Bed Mobility: Not assessed Transfers Transfers: Sit to Stand;Stand to Sit Sit to Stand: 4: Min guard;With upper extremity assist;From chair/3-in-1;With armrests Stand to Sit: 4: Min guard;With upper extremity assist;To chair/3-in-1;With armrests Details for Transfer Assistance: cues for UE use, getting closer to chair prior to sitting.   Ambulation/Gait Ambulation/Gait Assistance: 4: Min guard Ambulation Distance (Feet): 140 Feet Assistive device: Rolling walker Ambulation/Gait Assistance Details: cues for positioning in RW, gait sequencing, upright posture.   Gait Pattern: Step-to pattern;Decreased step length - left;Decreased stance time - right;Trunk flexed Stairs: No Wheelchair Mobility Wheelchair Mobility: No    Exercises Total Joint Exercises Long Arc Quad: AAROM;Right;10 reps Knee Flexion: AAROM;Right;10 reps   PT Diagnosis:    PT Problem List:   PT Treatment Interventions:     PT Goals Acute Rehab PT  Goals Time For Goal Achievement: 01/11/13 Potential to Achieve Goals: Good PT Goal: Sit to Stand - Progress: Progressing toward goal PT Goal: Ambulate - Progress: Progressing toward goal PT Goal: Perform Home Exercise Program - Progress: Progressing toward goal  Visit Information  Last PT Received On: 01/05/13 Assistance Needed: +1    Subjective Data  Subjective: It's still a little numb today, but better than it was yesterday.     Cognition  Cognition Overall Cognitive Status: Appears within functional limits for tasks assessed/performed Arousal/Alertness: Awake/alert Orientation Level: Appears intact for tasks assessed Behavior During Session: [facility] for tasks performed    Balance  Balance Balance Assessed: No  End of Session PT - End of Session Equipment Utilized During Treatment: Gait belt Activity Tolerance: Patient tolerated treatment well Patient left: in chair;with call bell/phone within reach Nurse Communication: Mobility status CPM Right Knee CPM Right Knee: Off   GP     [name], [place] [phone] 01/05/2013, 9:50 AM

## 2013-01-05 NOTE — Plan of Care (Signed)
Problem: Phase II Progression Outcomes Goal: Discharge plan established Recommend no follow up OT after acute care d/c, pt will need shower chair for home use

## 2013-01-05 NOTE — Progress Notes (Signed)
PT is recommending home with HH and not SNF.  Clinical Social Worker will sign off for now as social work intervention is no longer needed. Please consult us again if new need arises.   Chena Chohan, MSW [phone] 

## 2013-01-05 NOTE — Progress Notes (Signed)
Physical Therapy Note   01/05/13 1400  PT Visit Information  Last PT Received On 01/05/13  Assistance Needed +1  PT Time Calculation  PT Start Time 1328  PT Stop Time 1352  PT Time Calculation (min) 24 min  Subjective Data  Subjective I'm doing well.   Precautions  Precautions Fall;Knee  Restrictions  Weight Bearing Restrictions Yes  RLE Weight Bearing WBAT  Cognition  Overall Cognitive Status Appears within functional limits for tasks assessed/performed  Arousal/Alertness Awake/alert  Orientation Level Appears intact for tasks assessed  Behavior During Session [facility] for tasks performed  Bed Mobility  Bed Mobility Supine to Sit;Sitting - Scoot to Edge of Bed;Sit to Supine  Supine to Sit 5: Supervision  Sitting - Scoot to Edge of Bed 5: Supervision  Sit to Supine 5: Supervision  Details for Bed Mobility Assistance Demos good technique.    Transfers  Transfers Sit to Stand;Stand to Sit  Sit to Stand 5: Supervision;With upper extremity assist;From bed  Stand to Sit 5: Supervision;With upper extremity assist;To bed  Details for Transfer Assistance Demos good technique.    Ambulation/Gait  Ambulation/Gait Assistance 4: Min guard  Ambulation Distance (Feet) 200 Feet (x2)  Assistive device Rolling walker  Ambulation/Gait Assistance Details cues for gait sequencing, upright posture.    Gait Pattern Step-to pattern;Decreased step length - left;Decreased stance time - right;Trunk flexed  Stairs Yes  Stairs Assistance 4: Min guard  Stair Management Technique No rails;Forwards;With walker  Number of Stairs 1 (x2)  Wheelchair Mobility  Wheelchair Mobility No  Balance  Balance Assessed No  PT - End of Session  Equipment Utilized During Treatment Gait belt  Activity Tolerance Patient tolerated treatment well  Patient left in bed;in CPM;with call bell/phone within reach;with family/visitor present  Nurse Communication Mobility status  PT - Assessment/Plan  Comments on Treatment  Session pt presents with R Uniknee.  pt moving great and anticipate ready for D/C to home tomorrow from PT stand point.    PT Plan Discharge plan remains appropriate;Frequency remains appropriate  PT Frequency 7X/week  Follow Up Recommendations Home health PT;Supervision/Assistance - 24 hour  PT equipment (shower seat)  Acute Rehab PT Goals  PT Goal Formulation With patient  Time For Goal Achievement 01/11/13  Potential to Achieve Goals Good  PT Goal: Supine/Side to Sit - Progress Progressing toward goal  PT Goal: Sit to Supine/Side - Progress Progressing toward goal  PT Goal: Sit to Stand - Progress Met  PT Goal: Ambulate - Progress Progressing toward goal  PT Goal: Up/Down Stairs - Progress Met  PT General Charges  $$ ACUTE PT VISIT 1 Procedure  PT Treatments  $Gait Training 23-37 mins   [place], Early [phone]

## 2013-01-05 NOTE — Progress Notes (Signed)
CARE MANAGEMENT NOTE 01/05/2013  Patient:  Anne Pearson, Anne Pearson   Account Number:  [id number]  Date Initiated:  01/05/2013  Documentation initiated by:  Vance Peper  Subjective/Objective Assessment:   72 yr old female s/p right partial knee replacement     Action/Plan:   CM spoke with patient concerning home health and DME at discharge. Choice offered. patient preoperatively setup with [facility], no changes. CM faxed orders and F2F to [facility] @ [phone].   Anticipated DC Date:  01/06/2013   Anticipated DC Plan:  HOME W HOME HEALTH SERVICES      DC Planning Services  CM consult      [facility] Choice  HOME HEALTH   Choice offered to / List presented to:  C-1 Patient        HH arranged  HH-2 PT      HH agency  OTHER - SEE NOTE   Status of service:  Completed, signed off Medicare Important Message given?   (If response is "NO", the following Medicare IM given date fields will be blank) Date Medicare IM given:   Date Additional Medicare IM given:    Discharge Disposition:  HOME W HOME HEALTH SERVICES  Per UR Regulation:    If discussed at Long Length of Stay Meetings, dates discussed:    Comments:  01/05/13 2:39pm Vance Peper, RN BSN Case Manager Home Health to be provided by [facility] [phone]

## 2013-01-05 NOTE — Discharge Summary (Signed)
SPORTS MEDICINE & JOINT REPLACEMENT   Georgena Spurling, MD   Altamese Cabal, PA-C [address] [place], [place], [place]  [address]                             [phone]  PATIENT ID: Anne Pearson        MRN:  [id number]          DOB/AGE: Apr 15, 1941 / 72 y.o.    DISCHARGE SUMMARY  ADMISSION DATE:    01/04/2013 DISCHARGE DATE:   01/05/2013   ADMISSION DIAGNOSIS: osteoarthritis medial compartment right knee    DISCHARGE DIAGNOSIS:  osteoarthritis medial compartment right knee    ADDITIONAL DIAGNOSIS: Active Problems:   * No active hospital problems. *  Past Medical History  Diagnosis Date  . Hypertension   . Hypothyroidism   . Arthritis     PROCEDURE: Procedure(s): PARTIAL KNEE REPLACEMENT/UNICOMPARTMENTAL MEDIAL on 01/04/2013  CONSULTS:     HISTORY:  See H&P in chart  HOSPITAL COURSE:  Anne Pearson is a 72 y.o. admitted on 01/04/2013 and found to have a diagnosis of osteoarthritis medial compartment right knee.  After appropriate laboratory studies were obtained  they were taken to the operating room on 01/04/2013 and underwent Procedure(s): PARTIAL KNEE REPLACEMENT/UNICOMPARTMENTAL MEDIAL.   They were given perioperative antibiotics:  Anti-infectives   Start     Dose/Rate Route Frequency Ordered Stop   01/04/13 1400  ceFAZolin (ANCEF) IVPB 1 g/50 mL premix     1 g 100 mL/hr over 30 Minutes Intravenous Every 6 hours 01/04/13 1114 01/04/13 2048   01/04/13 0600  ceFAZolin (ANCEF) IVPB 2 g/50 mL premix     2 g 100 mL/hr over 30 Minutes Intravenous On call to O.R. 01/03/13 1631 01/04/13 0743    .  Tolerated the procedure well.  Placed with a foley intraoperatively.  Given Ofirmev at induction and for 48 hours.    POD# 1: Vital signs were stable.  Patient denied Chest pain, shortness of breath, or calf pain.  Patient was started on Lovenox 30 mg subcutaneously twice daily at 8am.  Consults to PT, OT, and care management were made.  The patient was weight bearing as  tolerated.  CPM was placed on the operative leg 0-90 degrees for 6-8 hours a day.  Incentive spirometry was taught.  Dressing was changed.  Marcaine pump and hemovac were discontinued.      POD #2, Continued  PT for ambulation and exercise program.  IV saline locked.  O2 discontinued.    The remainder of the hospital course was dedicated to ambulation and strengthening.   The patient was discharged on 1 Day Post-Op in  Good condition.  Blood products given:none  DIAGNOSTIC STUDIES: Recent vital signs: Patient Vitals for the past 24 hrs:  BP Temp Pulse Resp SpO2  01/05/13 0730 112/48 mmHg 97.5 F (36.4 C) 65 16 98 %  01/05/13 0558 102/51 mmHg 97.8 F (36.6 C) 63 18 97 %  01/05/13 0122 99/42 mmHg 97.8 F (36.6 C) 73 18 97 %  01/04/13 2123 106/48 mmHg 97.8 F (36.6 C) 66 18 98 %       Recent laboratory studies:  Recent Labs  12/31/12 1346 01/04/13 1149 01/05/13 0626  WBC 4.3 5.3 6.3  HGB 13.6 12.8 11.2*  HCT 40.4 37.4 33.8*  PLT 218 184 166    Recent Labs  12/31/12 1346 01/04/13 1149 01/05/13 0626  NA 139  --  138  K 4.0  --  4.2  CL 104  --  106  CO2 25  --  26  BUN 22  --  15  CREATININE 0.64 0.78 0.78  GLUCOSE 87  --  140*  CALCIUM 9.7  --  7.9*   Lab Results  Component Value Date   INR 0.94 12/31/2012     Recent Radiographic Studies :  Dg Chest 2 View  12/31/2012  *RADIOLOGY REPORT*  Clinical Data: Preoperative evaluation for knee surgery  CHEST - 2 VIEW  Comparison: None.  Findings: The heart pulmonary vascularity are within normal limits. The lungs are well-aerated bilaterally without focal infiltrate. No acute bony abnormality is seen.  IMPRESSION: No acute abnormality noted.   Original Report Authenticated By: Alcide Clever, M.D.    Dg C-arm 1-60 Min-no Report  01/04/2013  CLINICAL DATA: surgery   C-ARM 1-60 MINUTES  Fluoroscopy was utilized by the requesting physician.  No radiographic  interpretation.      DISCHARGE INSTRUCTIONS: Discharge Orders    Future Orders Complete By Expires     CPM  As directed     Comments:      Continuous passive motion machine (CPM):      Use the CPM from 0 to 90 for 6-8 hours per day.      You may increase by 10 per day.  You may break it up into 2 or 3 sessions per day.      Use CPM for 2 weeks or until you are told to stop.    Call MD / Call 911  As directed     Comments:      If you experience chest pain or shortness of breath, CALL 911 and be transported to the hospital emergency room.  If you develope a fever above 101 F, pus (white drainage) or increased drainage or redness at the wound, or calf pain, call your surgeon's office.    Change dressing  As directed     Comments:      Change dressing on wednesday, then change the dressing daily with sterile 4 x 4 inch gauze dressing and apply TED hose.  You may clean the incision with alcohol prior to redressing.    Constipation Prevention  As directed     Comments:      Drink plenty of fluids.  Prune juice may be helpful.  You may use a stool softener, such as Colace (over the counter) 100 mg twice a day.  Use MiraLax (over the counter) for constipation as needed.    Diet - low sodium heart healthy  As directed     Do not put a pillow under the knee. Place it under the heel.  As directed     Driving restrictions  As directed     Comments:      No driving for 6 weeks    Increase activity slowly as tolerated  As directed     Lifting restrictions  As directed     Comments:      No lifting for 6 weeks    TED hose  As directed     Comments:      Use stockings (TED hose) for 3 weeks on both leg(s).  You may remove them at night for sleeping.       DISCHARGE MEDICATIONS:     Medication List    STOP taking these medications       traMADol 50 MG tablet  Commonly known as:  ULTRAM      TAKE these medications       ALPRAZolam 0.5 MG tablet  Commonly known as:  XANAX  Take 0.5 mg by mouth at bedtime.     CALCIUM-VITAMIN D PO  Take 1 tablet by  mouth daily.     celecoxib 200 MG capsule  Commonly known as:  CELEBREX  Take 1 capsule (200 mg total) by mouth every 12 (twelve) hours.     enoxaparin 40 MG/0.4ML injection  Commonly known as:  LOVENOX  Inject 0.4 mLs (40 mg total) into the skin daily.     fluticasone 50 MCG/ACT nasal spray  Commonly known as:  FLONASE  Place 2 sprays into the nose daily.     levothyroxine 112 MCG tablet  Commonly known as:  SYNTHROID, LEVOTHROID  Take 112 mcg by mouth daily.     lisinopril 10 MG tablet  Commonly known as:  PRINIVIL,ZESTRIL  Take 10 mg by mouth daily.     loratadine 10 MG tablet  Commonly known as:  CLARITIN  Take 10 mg by mouth daily.     methocarbamol 500 MG tablet  Commonly known as:  ROBAXIN  Take 1-2 tablets (500-1,000 mg total) by mouth every 6 (six) hours as needed.     oxyCODONE 5 MG immediate release tablet  Commonly known as:  Oxy IR/ROXICODONE  Take 1-2 tablets (5-10 mg total) by mouth every 4 (four) hours as needed for pain.     Vitamin D 2000 UNITS Caps  Take 1 capsule by mouth daily.        FOLLOW UP VISIT:       Follow-up Information   Follow up with Raymon Mutton, MD. Call on 01/19/2013.   Contact information:   [address] [place] [address] [phone]       DISPOSITION: HOME   CONDITION:  Good   Aalivia Mcgraw 01/05/2013, 12:58 PM

## 2013-01-05 NOTE — Evaluation (Signed)
Occupational Therapy Evaluation Patient Details Name: Anne Pearson MRN: [id number] DOB: 01/17/1941 Today's Date: 01/05/2013 Time: 4098-1191 OT Time Calculation (min): 27 min  OT Assessment / Plan / Recommendation Clinical Impression  Pt referred to OT services following R knee surgery. Pt at set up/sup level with UB ADLs and min A/min guard level with LB ADLs. Pt will have 24 hour A at home and no further or follow up OT needed at this time. All education completed and OT will sign off    OT Assessment  Patient does not need any further OT services    Follow Up Recommendations  No OT follow up    Barriers to Discharge  none    Equipment Recommendations  Tub/shower seat    Recommendations for Other Services    Frequency       Precautions / Restrictions Precautions Precautions: Fall;Knee Restrictions Weight Bearing Restrictions: Yes RLE Weight Bearing: Weight bearing as tolerated       ADL  Grooming: Performed;Wash/dry hands;Wash/dry face;Supervision/safety;Set up Where Assessed - Grooming: Supported standing Upper Body Bathing: Simulated;Supervision/safety;Set up Where Assessed - Upper Body Bathing: Unsupported sitting Lower Body Bathing: Simulated;Min guard Where Assessed - Lower Body Bathing: Unsupported sitting;Supported standing Upper Body Dressing: Performed;Supervision/safety;Set up Where Assessed - Upper Body Dressing: Unsupported sitting Lower Body Dressing: Simulated;Min guard;Minimal assistance Where Assessed - Lower Body Dressing: Supported standing;Unsupported sitting Toilet Transfer: Performed;Min guard Toilet Transfer Method: Sit to stand;Other (comment) (ambulating from RW level) Toilet Transfer Equipment: Raised toilet seat with arms (or 3-in-1 over toilet) Toileting - Clothing Manipulation and Hygiene: Performed;Min guard Where Assessed - Engineer, mining and Hygiene: Standing Tub/Shower Transfer: Performed;Min guard Tub/Shower Transfer  Method: Science writer: Shower seat with back Equipment Used: Long-handled shoe horn;Long-handled sponge;Reacher;Gait belt;Sock aid;Rolling walker;Other (comment) (3 in 1, shower seat) ADL Comments: Reviewed ADL A/E with pt, pt familiar with A/E from L knee surgery and did not need it. Pt would like to have a shower seat    OT Diagnosis:    OT Problem List:   OT Treatment Interventions:     OT Goals    Visit Information  Last OT Received On: 01/05/13 Assistance Needed: +1    Subjective Data  Subjective: " I may be able to go home this evening " Patient Stated Goal: To return home   Prior Functioning     Home Living Lives With: Spouse Available Help at Discharge: Family;Available 24 hours/day Type of Home: House Entrance Stairs-Number of Steps: 1 Entrance Stairs-Rails: None Home Layout: One level Bathroom Shower/Tub: Health visitor: Handicapped height Bathroom Accessibility: Yes Home Adaptive Equipment: Walker - rolling Additional Comments: Patient states she would like a shower seat Prior Function Level of [place]: Independent Able to Take Stairs?: Yes Driving: Yes Vocation: Retired Musician: No difficulties Dominant Hand: Left         Vision/Perception Vision - History Baseline Vision: Wears glasses only for reading Patient Visual Report: No change from baseline Vision - Assessment Eye Alignment: Within Functional Limits Perception Perception: Within Functional Limits   Cognition  Cognition Overall Cognitive Status: Appears within functional limits for tasks assessed/performed Arousal/Alertness: Awake/alert Orientation Level: Appears intact for tasks assessed Behavior During Session: [facility] for tasks performed    Extremity/Trunk Assessment Right Upper Extremity Assessment RUE ROM/Strength/Tone: [facility] for tasks assessed Left Upper Extremity Assessment LUE ROM/Strength/Tone: WFL for tasks  assessed     Mobility Bed Mobility Bed Mobility: Not assessed Transfers Transfers: Sit to Stand;Stand to Sit Sit to Stand:  4: Min guard;With armrests;Without upper extremity assist;From chair/3-in-1 Stand to Sit: 4: Min guard;With armrests;Without upper extremity assist;To chair/3-in-1 Details for Transfer Assistance: cues for UE use, getting closer to chair prior to sitting.       Exercise Total Joint Exercises Long Arc Quad: AAROM;Right;10 reps Knee Flexion: AAROM;Right;10 reps   Balance Balance Balance Assessed: No   End of Session OT - End of Session Equipment Utilized During Treatment: Gait belt;Other (comment) (RW, 3 in 1, shower seat, ADL A/E) Activity Tolerance: Patient tolerated treatment well Patient left: in chair;with call bell/phone within reach CPM Right Knee CPM Right Knee: Off  GO     Margaretmary Eddy [facility] 01/05/2013, 11:36 AM

## 2013-01-05 NOTE — Progress Notes (Signed)
SPORTS MEDICINE AND JOINT REPLACEMENT  Georgena Spurling, MD   Altamese Cabal, PA-C [address] [place], [place], [place]  [address]                             [phone]   PROGRESS NOTE  Subjective:  negative for Chest Pain  negative for Shortness of Breath  negative for Nausea/Vomiting   negative for Calf Pain  negative for Bowel Movement   Tolerating Diet: yes         Patient reports pain as 4 on 0-10 scale.    Objective: Vital signs in last 24 hours:   Patient Vitals for the past 24 hrs:  BP Temp Pulse Resp SpO2  01/05/13 0558 102/51 mmHg 97.8 F (36.6 C) 63 18 97 %  01/05/13 0122 99/42 mmHg 97.8 F (36.6 C) 73 18 97 %  01/04/13 2123 106/48 mmHg 97.8 F (36.6 C) 66 18 98 %  01/04/13 1110 120/54 mmHg 98.1 F (36.7 C) 58 16 100 %  01/04/13 1045 112/52 mmHg - 56 18 100 %  01/04/13 1044 - 97.7 F (36.5 C) - - -  01/04/13 1030 127/54 mmHg - 60 20 100 %  01/04/13 1015 101/78 mmHg - 66 24 99 %  01/04/13 1000 123/65 mmHg - 67 27 100 %  01/04/13 0945 120/64 mmHg - 67 21 100 %  01/04/13 0930 101/84 mmHg - 69 23 99 %  01/04/13 0917 115/39 mmHg 97.6 F (36.4 C) 67 17 100 %    @flow {1959:LAST@   Intake/Output from previous day:   02/24 0701 - 02/25 0700 In: 2890 [P.O.:480; I.V.:2410] Out: 3225 [Urine:3225]   Intake/Output this shift:       Intake/Output     02/24 0701 - 02/25 0700 02/25 0701 - 02/26 0700   P.O. 480    I.V. 2410    Total Intake 2890     Urine 3225    Total Output 3225     Net -335             LABORATORY DATA:  Recent Labs  12/31/12 1346 01/04/13 1149  WBC 4.3 5.3  HGB 13.6 12.8  HCT 40.4 37.4  PLT 218 184    Recent Labs  12/31/12 1346 01/04/13 1149  NA 139  --   K 4.0  --   CL 104  --   CO2 25  --   BUN 22  --   CREATININE 0.64 0.78  GLUCOSE 87  --   CALCIUM 9.7  --    Lab Results  Component Value Date   INR 0.94 12/31/2012    Examination:  General appearance: alert, cooperative and no distress Extremities: Homans  sign is negative, no sign of DVT  Wound Exam: clean, dry, intact   Drainage:  None: wound tissue dry  Motor Exam: EHL and FHL Intact  Sensory Exam: Deep Peroneal normal   Assessment:    1 Day Post-Op  Procedure(s) (LRB): PARTIAL KNEE REPLACEMENT/UNICOMPARTMENTAL MEDIAL (Right)  ADDITIONAL DIAGNOSIS:  Active Problems:   * No active hospital problems. *  Acute Blood Loss Anemia   Plan: Physical Therapy as ordered Weight Bearing as Tolerated (WBAT)  DVT Prophylaxis:  Lovenox  DISCHARGE PLAN: Home  DISCHARGE NEEDS: HHPT, CPM, Walker and 3-in-1 comode seat         Kriste Broman 01/05/2013, 7:27 AM

## 2013-01-06 LAB — BASIC METABOLIC PANEL
Chloride: 103 mEq/L (ref 96–112)
Creatinine, Ser: 0.63 mg/dL (ref 0.50–1.10)
GFR calc Af Amer: >90 mL/min (ref >90)

## 2013-01-06 LAB — CBC
MCV: 90.4 fL (ref 78.0–100.0)
Platelets: 182 10*3/uL (ref 150–400)
RDW: 12.6 % (ref 11.5–15.5)
WBC: 4.5 10*3/uL (ref 4.0–10.5)

## 2013-01-06 NOTE — Progress Notes (Signed)
Physical Therapy Treatment Patient Details Name: Anne Pearson MRN: [id number] DOB: 04-11-1941 Today's Date: 01/06/2013 Time: 0811-0830 PT Time Calculation (min): 19 min  PT Assessment / Plan / Recommendation Comments on Treatment Session  pt presents with R Uniknee.  pt moving great and ready for D/C from PT stand point.      Follow Up Recommendations  Home health PT;Supervision/Assistance - 24 hour     Does the patient have the potential to tolerate intense rehabilitation     Barriers to Discharge        Equipment Recommendations   (shower seat)    Recommendations for Other Services    Frequency 7X/week   Plan Discharge plan remains appropriate;Frequency remains appropriate    Precautions / Restrictions Precautions Precautions: Fall;Knee Restrictions Weight Bearing Restrictions: Yes RLE Weight Bearing: Weight bearing as tolerated   Pertinent Vitals/Pain 7/10.  Just medicated.      Mobility  Bed Mobility Bed Mobility: Supine to Sit;Sitting - Scoot to Edge of Bed;Sit to Supine Supine to Sit: 5: Supervision Sitting - Scoot to Edge of Bed: 5: Supervision Sit to Supine: 5: Supervision Details for Bed Mobility Assistance: Demos good technique.   Transfers Transfers: Sit to Stand;Stand to Sit Sit to Stand: 5: Supervision;With upper extremity assist;From bed Stand to Sit: 5: Supervision;With upper extremity assist;To bed Details for Transfer Assistance: Demos good technique.   Ambulation/Gait Ambulation/Gait Assistance: 5: Supervision Ambulation Distance (Feet): 400 Feet Assistive device: Rolling walker Ambulation/Gait Assistance Details: cues for more continuous gait pattern and upright posture.   Gait Pattern: Step-to pattern;Decreased step length - left;Decreased stance time - right;Trunk flexed Stairs: No Wheelchair Mobility Wheelchair Mobility: No    Exercises     PT Diagnosis:    PT Problem List:   PT Treatment Interventions:     PT Goals Acute Rehab PT  Goals Time For Goal Achievement: 01/11/13 Potential to Achieve Goals: Good PT Goal: Supine/Side to Sit - Progress: Progressing toward goal PT Goal: Sit to Supine/Side - Progress: Progressing toward goal PT Goal: Sit to Stand - Progress: Met PT Goal: Ambulate - Progress: Met  Visit Information  Last PT Received On: 01/06/13 Assistance Needed: +1    Subjective Data  Subjective: I'm ready to go this morning.     Cognition  Cognition Overall Cognitive Status: Appears within functional limits for tasks assessed/performed Arousal/Alertness: Awake/alert Orientation Level: Appears intact for tasks assessed Behavior During Session: [facility] for tasks performed    Balance  Balance Balance Assessed: No  End of Session PT - End of Session Equipment Utilized During Treatment: Gait belt Activity Tolerance: Patient tolerated treatment well Patient left: in bed;in CPM;with call bell/phone within reach Nurse Communication: Mobility status   GP     [name], [place] [phone] 01/06/2013, 9:16 AM

## 2013-01-06 NOTE — Progress Notes (Signed)
SPORTS MEDICINE AND JOINT REPLACEMENT  Anne Spurling, MD   Anne Cabal, PA-C [address] [place], [place], [place]  [address]                             [phone]   PROGRESS NOTE  Subjective:  negative for Chest Pain  negative for Shortness of Breath  negative for Nausea/Vomiting   negative for Calf Pain  negative for Bowel Movement   Tolerating Diet: yes         Patient reports pain as 5 on 0-10 scale.    Objective: Vital signs in last 24 hours:   Patient Vitals for the past 24 hrs:  BP Temp Temp src Pulse Resp SpO2  01/06/13 0623 113/55 mmHg 98.5 F (36.9 C) Oral 64 16 98 %  01/06/13 0400 - - - - 16 -  01/06/13 0000 - - - - 16 -  01/05/13 2219 122/50 mmHg 98.4 F (36.9 C) Oral 66 18 96 %  01/05/13 2000 - - - - 16 -  01/05/13 1400 109/49 mmHg 98.1 F (36.7 C) Oral 63 20 99 %    @flow {1959:LAST@   Intake/Output from previous day:   02/25 0701 - 02/26 0700 In: 720 [P.O.:720] Out: -    Intake/Output this shift:       Intake/Output     02/25 0701 - 02/26 0700 02/26 0701 - 02/27 0700   P.O. 720    I.V.     Total Intake 720     Urine     Total Output       Net +720          Urine Occurrence 6 x       LABORATORY DATA:  Recent Labs  12/31/12 1346 01/04/13 1149 01/05/13 0626 01/06/13 0644  WBC 4.3 5.3 6.3 4.5  HGB 13.6 12.8 11.2* 12.6  HCT 40.4 37.4 33.8* 38.5  PLT 218 184 166 182    Recent Labs  12/31/12 1346 01/04/13 1149 01/05/13 0626 01/06/13 0644  NA 139  --  138 139  K 4.0  --  4.2 3.8  CL 104  --  106 103  CO2 25  --  26 28  BUN 22  --  15 12  CREATININE 0.64 0.78 0.78 0.63  GLUCOSE 87  --  140* 99  CALCIUM 9.7  --  7.9* 9.1   Lab Results  Component Value Date   INR 0.94 12/31/2012    Examination:  General appearance: alert, cooperative and no distress Extremities: Homans sign is negative, no sign of DVT  Wound Exam: clean, dry, intact   Drainage:  None: wound tissue dry  Motor Exam: EHL and FHL Intact  Sensory  Exam: Deep Peroneal normal   Assessment:    2 Days Post-Op  Procedure(s) (LRB): PARTIAL KNEE REPLACEMENT/UNICOMPARTMENTAL MEDIAL (Right)  ADDITIONAL DIAGNOSIS:  Active Problems:   * No active hospital problems. *  Acute Blood Loss Anemia   Plan: Physical Therapy as ordered Weight Bearing as Tolerated (WBAT)  DVT Prophylaxis:  Lovenox  DISCHARGE PLAN: Home  DISCHARGE NEEDS: HHPT, CPM, Walker and 3-in-1 comode seat         Anne Pearson 01/06/2013, 11:10 AM

## 2013-01-06 NOTE — Progress Notes (Signed)
Pt discharged to home accompanied by husband. Discharge instructions and rx given and explained and pt stated understanding. Pts IV was removed. Pt left unit in a stable condition via wheelchair. 

## 2013-01-06 NOTE — Discharge Summary (Signed)
SPORTS MEDICINE & JOINT REPLACEMENT   Anne Spurling, MD   Anne Cabal, PA-C [address] [place], [place], [place]  [address]                             [phone]  PATIENT ID: Anne Pearson        MRN:  [id number]          DOB/AGE: December 04, 1940 / 72 y.o.    DISCHARGE SUMMARY  ADMISSION DATE:    01/04/2013 DISCHARGE DATE:   01/06/2013   ADMISSION DIAGNOSIS: osteoarthritis medial compartment right knee    DISCHARGE DIAGNOSIS:  osteoarthritis medial compartment right knee    ADDITIONAL DIAGNOSIS: Active Problems:   * No active hospital problems. *  Past Medical History  Diagnosis Date  . Hypertension   . Hypothyroidism   . Arthritis     PROCEDURE: Procedure(s): PARTIAL KNEE REPLACEMENT/UNICOMPARTMENTAL MEDIAL on 01/04/2013  CONSULTS:     HISTORY:  See H&P in chart  HOSPITAL COURSE:  Anne Pearson is a 72 y.o. admitted on 01/04/2013 and found to have a diagnosis of osteoarthritis medial compartment right knee.  After appropriate laboratory studies were obtained  they were taken to the operating room on 01/04/2013 and underwent Procedure(s): PARTIAL KNEE REPLACEMENT/UNICOMPARTMENTAL MEDIAL.   They were given perioperative antibiotics:  Anti-infectives   Start     Dose/Rate Route Frequency Ordered Stop   01/04/13 1400  ceFAZolin (ANCEF) IVPB 1 g/50 mL premix     1 g 100 mL/hr over 30 Minutes Intravenous Every 6 hours 01/04/13 1114 01/04/13 2048   01/04/13 0600  ceFAZolin (ANCEF) IVPB 2 g/50 mL premix     2 g 100 mL/hr over 30 Minutes Intravenous On call to O.R. 01/03/13 1631 01/04/13 0743    .  Tolerated the procedure well.  Placed with a foley intraoperatively.  Given Ofirmev at induction and for 48 hours.    POD# 1: Vital signs were stable.  Patient denied Chest pain, shortness of breath, or calf pain.  Patient was started on Lovenox 30 mg subcutaneously twice daily at 8am.  Consults to PT, OT, and care management were made.  The patient was weight bearing as  tolerated.  CPM was placed on the operative leg 0-90 degrees for 6-8 hours a day.  Incentive spirometry was taught.  Dressing was changed.  Marcaine pump and hemovac were discontinued.      POD #2, Continued  PT for ambulation and exercise program.  IV saline locked.  O2 discontinued.    The remainder of the hospital course was dedicated to ambulation and strengthening.   The patient was discharged on 2 Days Post-Op in  Good condition.  Blood products given:none  DIAGNOSTIC STUDIES: Recent vital signs: Patient Vitals for the past 24 hrs:  BP Temp Temp src Pulse Resp SpO2  01/06/13 0623 113/55 mmHg 98.5 F (36.9 C) Oral 64 16 98 %  01/06/13 0400 - - - - 16 -  01/06/13 0000 - - - - 16 -  01/05/13 2219 122/50 mmHg 98.4 F (36.9 C) Oral 66 18 96 %  01/05/13 2000 - - - - 16 -  01/05/13 1400 109/49 mmHg 98.1 F (36.7 C) Oral 63 20 99 %       Recent laboratory studies:  Recent Labs  12/31/12 1346 01/04/13 1149 01/05/13 0626 01/06/13 0644  WBC 4.3 5.3 6.3 4.5  HGB 13.6 12.8 11.2* 12.6  HCT 40.4 37.4 33.8* 38.5  PLT 218 184 166 182    Recent Labs  12/31/12 1346 01/04/13 1149 01/05/13 0626 01/06/13 0644  NA 139  --  138 139  K 4.0  --  4.2 3.8  CL 104  --  106 103  CO2 25  --  26 28  BUN 22  --  15 12  CREATININE 0.64 0.78 0.78 0.63  GLUCOSE 87  --  140* 99  CALCIUM 9.7  --  7.9* 9.1   Lab Results  Component Value Date   INR 0.94 12/31/2012     Recent Radiographic Studies :  Dg Chest 2 View  12/31/2012  *RADIOLOGY REPORT*  Clinical Data: Preoperative evaluation for knee surgery  CHEST - 2 VIEW  Comparison: None.  Findings: The heart pulmonary vascularity are within normal limits. The lungs are well-aerated bilaterally without focal infiltrate. No acute bony abnormality is seen.  IMPRESSION: No acute abnormality noted.   Original Report Authenticated By: Alcide Clever, M.D.    Dg C-arm 1-60 Min-no Report  01/04/2013  CLINICAL DATA: surgery   C-ARM 1-60 MINUTES   Fluoroscopy was utilized by the requesting physician.  No radiographic  interpretation.      DISCHARGE INSTRUCTIONS: Discharge Orders   Future Orders Complete By Expires     CPM  As directed     Comments:      Continuous passive motion machine (CPM):      Use the CPM from 0 to 90 for 6-8 hours per day.      You may increase by 10 per day.  You may break it up into 2 or 3 sessions per day.      Use CPM for 2 weeks or until you are told to stop.    Call MD / Call 911  As directed     Comments:      If you experience chest pain or shortness of breath, CALL 911 and be transported to the hospital emergency room.  If you develope a fever above 101 F, pus (white drainage) or increased drainage or redness at the wound, or calf pain, call your surgeon's office.    Change dressing  As directed     Comments:      Change dressing on wednesday, then change the dressing daily with sterile 4 x 4 inch gauze dressing and apply TED hose.  You may clean the incision with alcohol prior to redressing.    Constipation Prevention  As directed     Comments:      Drink plenty of fluids.  Prune juice may be helpful.  You may use a stool softener, such as Colace (over the counter) 100 mg twice a day.  Use MiraLax (over the counter) for constipation as needed.    Diet - low sodium heart healthy  As directed     Do not put a pillow under the knee. Place it under the heel.  As directed     Driving restrictions  As directed     Comments:      No driving for 6 weeks    Increase activity slowly as tolerated  As directed     Lifting restrictions  As directed     Comments:      No lifting for 6 weeks    TED hose  As directed     Comments:      Use stockings (TED hose) for 3 weeks on both leg(s).  You may remove them at night for sleeping.  DISCHARGE MEDICATIONS:     Medication List    STOP taking these medications       traMADol 50 MG tablet  Commonly known as:  ULTRAM      TAKE these medications        ALPRAZolam 0.5 MG tablet  Commonly known as:  XANAX  Take 0.5 mg by mouth at bedtime.     CALCIUM-VITAMIN D PO  Take 1 tablet by mouth daily.     celecoxib 200 MG capsule  Commonly known as:  CELEBREX  Take 1 capsule (200 mg total) by mouth every 12 (twelve) hours.     enoxaparin 40 MG/0.4ML injection  Commonly known as:  LOVENOX  Inject 0.4 mLs (40 mg total) into the skin daily.     fluticasone 50 MCG/ACT nasal spray  Commonly known as:  FLONASE  Place 2 sprays into the nose daily.     levothyroxine 112 MCG tablet  Commonly known as:  SYNTHROID, LEVOTHROID  Take 112 mcg by mouth daily.     lisinopril 10 MG tablet  Commonly known as:  PRINIVIL,ZESTRIL  Take 10 mg by mouth daily.     loratadine 10 MG tablet  Commonly known as:  CLARITIN  Take 10 mg by mouth daily.     methocarbamol 500 MG tablet  Commonly known as:  ROBAXIN  Take 1-2 tablets (500-1,000 mg total) by mouth every 6 (six) hours as needed.     oxyCODONE 5 MG immediate release tablet  Commonly known as:  Oxy IR/ROXICODONE  Take 1-2 tablets (5-10 mg total) by mouth every 4 (four) hours as needed for pain.     Vitamin D 2000 UNITS Caps  Take 1 capsule by mouth daily.        FOLLOW UP VISIT:       Follow-up Information   Follow up with Raymon Mutton, MD. Call on 01/19/2013.   Contact information:   [address] [place] [address] [phone]       DISPOSITION: HOME   CONDITION:  Good   Anne Pearson 01/06/2013, 11:11 AM

## 2013-03-25 ENCOUNTER — Other Ambulatory Visit (HOSPITAL_COMMUNITY): Payer: Self-pay | Admitting: Orthopedic Surgery

## 2013-03-25 DIAGNOSIS — M25562 Pain in left knee: Secondary | ICD-10-CM

## 2013-03-31 ENCOUNTER — Other Ambulatory Visit (HOSPITAL_COMMUNITY): Payer: Medicare Other

## 2013-03-31 ENCOUNTER — Encounter (HOSPITAL_COMMUNITY): Payer: Medicare Other

## 2013-04-12 ENCOUNTER — Encounter (HOSPITAL_COMMUNITY)
Admission: RE | Admit: 2013-04-12 | Discharge: 2013-04-12 | Disposition: A | Discharge status: Home / Self Care | Payer: Medicare Other | Source: Ambulatory Visit | Attending: Orthopedic Surgery | Admitting: Orthopedic Surgery

## 2013-04-12 DIAGNOSIS — M25569 Pain in unspecified knee: Secondary | ICD-10-CM | POA: Insufficient documentation

## 2013-04-12 DIAGNOSIS — M25562 Pain in left knee: Secondary | ICD-10-CM

## 2013-04-12 MED ORDER — TECHNETIUM TC 99M MEDRONATE IV KIT
25.0000 | PACK | Freq: Once | INTRAVENOUS | Status: AC | PRN
  Administered 2013-04-12: 25 via INTRAVENOUS

## 2013-11-22 ENCOUNTER — Other Ambulatory Visit: Payer: Self-pay | Admitting: Orthopedic Surgery

## 2013-12-07 ENCOUNTER — Encounter (HOSPITAL_COMMUNITY): Payer: Self-pay | Admitting: Pharmacy Technician

## 2013-12-13 ENCOUNTER — Encounter (HOSPITAL_COMMUNITY)
Admission: RE | Admit: 2013-12-13 | Discharge: 2013-12-13 | Disposition: A | Discharge status: Home / Self Care | Payer: Medicare Other | Source: Ambulatory Visit | Attending: Orthopedic Surgery | Admitting: Orthopedic Surgery

## 2013-12-13 ENCOUNTER — Encounter (HOSPITAL_COMMUNITY): Payer: Self-pay

## 2013-12-13 DIAGNOSIS — Z01812 Encounter for preprocedural laboratory examination: Secondary | ICD-10-CM | POA: Insufficient documentation

## 2013-12-13 HISTORY — DX: Other specified postprocedural states: Z98.890

## 2013-12-13 HISTORY — DX: Nausea with vomiting, unspecified: R11.2

## 2013-12-13 LAB — URINALYSIS, ROUTINE W REFLEX MICROSCOPIC
BILIRUBIN URINE: NEGATIVE
GLUCOSE, UA: NEGATIVE mg/dL
Ketones, ur: NEGATIVE mg/dL
Leukocytes, UA: NEGATIVE
NITRITE: NEGATIVE
PH: 5 (ref 5.0–8.0)
Protein, ur: NEGATIVE mg/dL
Specific Gravity, Urine: 1.015 (ref 1.005–1.030)
Urobilinogen, UA: 0.2 mg/dL (ref 0.0–1.0)

## 2013-12-13 LAB — URINE MICROSCOPIC-ADD ON

## 2013-12-13 LAB — COMPREHENSIVE METABOLIC PANEL
ALT: 16 U/L (ref 0–35)
AST: 23 U/L (ref 0–37)
Albumin: 3.9 g/dL (ref 3.5–5.2)
Alkaline Phosphatase: 100 U/L (ref 39–117)
BUN: 24 mg/dL — ABNORMAL HIGH (ref 6–23)
CO2: 26 meq/L (ref 19–32)
Calcium: 9.2 mg/dL (ref 8.4–10.5)
Chloride: 102 mEq/L (ref 96–112)
Creatinine, Ser: 0.64 mg/dL (ref 0.50–1.10)
GFR calc Af Amer: >90 mL/min (ref >90)
GFR calc non Af Amer: 88 mL/min — ABNORMAL LOW (ref >90)
Glucose, Bld: 90 mg/dL (ref 70–99)
Potassium: 4 mEq/L (ref 3.7–5.3)
Sodium: 142 mEq/L (ref 137–147)
Total Bilirubin: 0.5 mg/dL (ref 0.3–1.2)
Total Protein: 7.1 g/dL (ref 6.0–8.3)

## 2013-12-13 LAB — CBC WITH DIFFERENTIAL/PLATELET
BASOS ABS: 0 10*3/uL (ref 0.0–0.1)
Basophils Relative: 0 % (ref 0–1)
Eosinophils Absolute: 0 10*3/uL (ref 0.0–0.7)
Eosinophils Relative: 1 % (ref 0–5)
HEMATOCRIT: 41.5 % (ref 36.0–46.0)
HEMOGLOBIN: 14 g/dL (ref 12.0–15.0)
LYMPHS PCT: 35 % (ref 12–46)
Lymphs Abs: 1.3 10*3/uL (ref 0.7–4.0)
MCH: 30 pg (ref 26.0–34.0)
MCHC: 33.7 g/dL (ref 30.0–36.0)
MCV: 89.1 fL (ref 78.0–100.0)
MONO ABS: 0.4 10*3/uL (ref 0.1–1.0)
MONOS PCT: 10 % (ref 3–12)
Neutro Abs: 2 10*3/uL (ref 1.7–7.7)
Neutrophils Relative %: 53 % (ref 43–77)
Platelets: 188 10*3/uL (ref 150–400)
RBC: 4.66 MIL/uL (ref 3.87–5.11)
RDW: 12.6 % (ref 11.5–15.5)
WBC: 3.7 10*3/uL — AB (ref 4.0–10.5)

## 2013-12-13 LAB — PROTIME-INR
INR: 0.96 (ref 0.00–1.49)
Prothrombin Time: 12.6 seconds (ref 11.6–15.2)

## 2013-12-13 LAB — APTT: aPTT: 28 seconds (ref 24–37)

## 2013-12-13 NOTE — Progress Notes (Signed)
PCp is Dr Alfonse Spruce Cardiologist is Dr Corena Pilgrim, but no longer sees him, unless changes noted. Denies having a recent Stress test, Echo, card cath.

## 2013-12-13 NOTE — Pre-Procedure Instructions (Signed)
Anne Pearson  12/13/2013   Your procedure is scheduled on:  Feb 9th at 16  Report to [place]  2 * 3 at [place] AM.  Call this number if you have problems the morning of surgery: [phone]   Remember:   Do not eat food or drink liquids after midnight.   Take these medicines the morning of surgery with A SIP OF WATER: Flonase spray, Levothyroxine (synthroid), Loratadine (Claritin)  Stop taking Aleve, Aspirin, Ibuprofen, BC's, Goody's, Herbal medications, Fish Oil, and Celebrex   Do not wear jewelry, make-up or nail polish.  Do not wear lotions, powders, or perfumes. You may wear deodorant.  Do not shave 48 hours prior to surgery. Men may shave face and neck.  Do not bring valuables to the hospital.  [facility] is not responsible                  for any belongings or valuables.               Contacts, dentures or bridgework may not be worn into surgery.  Leave suitcase in the car. After surgery it may be brought to your room.  For patients admitted to the hospital, discharge time is determined by your                treatment team.               Patients discharged the day of surgery will not be allowed to drive  home  Special Instructions: Shower the night before surgery and the morning of surgery.   Please read over the following fact sheets that you were given: Pain Booklet, Coughing and Deep Breathing, Blood Transfusion Information, MRSA Information and Surgical Site Infection Prevention

## 2013-12-14 NOTE — Progress Notes (Signed)
Anesthesia Chart Review:  Patient is a 73 year old female scheduled for left knee polyexchange on 12/20/13 by Dr. Ronnie Derby.  History includes right knee arthroscopy with partial unicompartmental arthroplasty 01/04/13, HTN, hypothyroidism, arthritis, former smoker, post-operative N/V. PCP is Dr. Alfonse Spruce.  She has seen cardiologist Dr. Corena Pilgrim in the past but now with only PRN follow-up. She had a prior stress and echo, but > 3 years agohe denied any history o  EKG on 12/31/12 showed NSR, cannot rule out anterior infarct (age undetermined). She reported prior stress and echo > 3 years ago.  She has seen cardiologist Dr. Corena Pilgrim in the past, but now with only PRN follow-up.  There was no reported cardiac history other than HTN.    Most recent CXR and labs in Epic noted.  She tolerated knee surgery in 12/2012.  If no acute changes then I anticipate that she can proceed as planned.  George Hugh [facility] Short Stay Center/Anesthesiology Phone [phone] 12/14/2013 12:25 PM

## 2013-12-19 MED ORDER — CEFAZOLIN SODIUM-DEXTROSE 2-3 GM-% IV SOLR
2.0000 g | INTRAVENOUS | Status: AC
  Administered 2013-12-20: 2 g via INTRAVENOUS
  Filled 2013-12-19: qty 50

## 2013-12-19 MED ORDER — TRANEXAMIC ACID 100 MG/ML IV SOLN
1000.0000 mg | INTRAVENOUS | Status: AC
  Administered 2013-12-20: 1000 mg via INTRAVENOUS
  Filled 2013-12-19: qty 10

## 2013-12-20 ENCOUNTER — Inpatient Hospital Stay (HOSPITAL_COMMUNITY): Payer: Medicare Other | Admitting: Anesthesiology

## 2013-12-20 ENCOUNTER — Inpatient Hospital Stay (HOSPITAL_COMMUNITY)
Admission: RE | Admit: 2013-12-20 | Discharge: 2013-12-21 | DRG: 488 | Disposition: A | Discharge status: Home Health | Payer: Medicare Other | Source: Ambulatory Visit | Attending: Orthopedic Surgery | Admitting: Orthopedic Surgery

## 2013-12-20 ENCOUNTER — Encounter (HOSPITAL_COMMUNITY): Payer: Self-pay | Admitting: *Deleted

## 2013-12-20 ENCOUNTER — Encounter (HOSPITAL_COMMUNITY)
Admission: RE | Disposition: A | Discharge status: Home Health | Payer: Self-pay | Source: Ambulatory Visit | Attending: Orthopedic Surgery

## 2013-12-20 ENCOUNTER — Encounter (HOSPITAL_COMMUNITY): Payer: Medicare Other | Admitting: Vascular Surgery

## 2013-12-20 DIAGNOSIS — Z87891 Personal history of nicotine dependence: Secondary | ICD-10-CM

## 2013-12-20 DIAGNOSIS — M6580 Other synovitis and tenosynovitis, unspecified site: Secondary | ICD-10-CM | POA: Diagnosis present

## 2013-12-20 DIAGNOSIS — E039 Hypothyroidism, unspecified: Secondary | ICD-10-CM | POA: Diagnosis present

## 2013-12-20 DIAGNOSIS — I10 Essential (primary) hypertension: Secondary | ICD-10-CM | POA: Diagnosis present

## 2013-12-20 DIAGNOSIS — T84099A Other mechanical complication of unspecified internal joint prosthesis, initial encounter: Principal | ICD-10-CM | POA: Diagnosis present

## 2013-12-20 DIAGNOSIS — Z888 Allergy status to other drugs, medicaments and biological substances status: Secondary | ICD-10-CM

## 2013-12-20 DIAGNOSIS — D62 Acute posthemorrhagic anemia: Secondary | ICD-10-CM | POA: Diagnosis not present

## 2013-12-20 DIAGNOSIS — Z96659 Presence of unspecified artificial knee joint: Secondary | ICD-10-CM

## 2013-12-20 DIAGNOSIS — Y831 Surgical operation with implant of artificial internal device as the cause of abnormal reaction of the patient, or of later complication, without mention of misadventure at the time of the procedure: Secondary | ICD-10-CM | POA: Diagnosis present

## 2013-12-20 DIAGNOSIS — M25562 Pain in left knee: Secondary | ICD-10-CM

## 2013-12-20 DIAGNOSIS — Z9089 Acquired absence of other organs: Secondary | ICD-10-CM

## 2013-12-20 HISTORY — PX: I & D KNEE WITH POLY EXCHANGE: SHX5024

## 2013-12-20 LAB — CBC
HEMATOCRIT: 39.4 % (ref 36.0–46.0)
Hemoglobin: 13 g/dL (ref 12.0–15.0)
MCH: 29.3 pg (ref 26.0–34.0)
MCHC: 33 g/dL (ref 30.0–36.0)
MCV: 88.7 fL (ref 78.0–100.0)
Platelets: 161 10*3/uL (ref 150–400)
RBC: 4.44 MIL/uL (ref 3.87–5.11)
RDW: 12.6 % (ref 11.5–15.5)
WBC: 5.9 10*3/uL (ref 4.0–10.5)

## 2013-12-20 LAB — CREATININE, SERUM: Creatinine, Ser: 0.6 mg/dL (ref 0.50–1.10)

## 2013-12-20 SURGERY — IRRIGATION AND DEBRIDEMENT KNEE WITH POLY EXCHANGE
Anesthesia: General | Site: Knee | Laterality: Left

## 2013-12-20 MED ORDER — LIDOCAINE HCL (CARDIAC) 20 MG/ML IV SOLN
INTRAVENOUS | Status: AC
  Filled 2013-12-20: qty 5

## 2013-12-20 MED ORDER — DOCUSATE SODIUM 100 MG PO CAPS
100.0000 mg | ORAL_CAPSULE | Freq: Two times a day (BID) | ORAL | Status: DC
  Administered 2013-12-20 – 2013-12-21 (×2): 100 mg via ORAL
  Filled 2013-12-20 (×3): qty 1

## 2013-12-20 MED ORDER — LORATADINE 10 MG PO TABS
10.0000 mg | ORAL_TABLET | Freq: Every day | ORAL | Status: DC
  Administered 2013-12-21: 10 mg via ORAL
  Filled 2013-12-20: qty 1

## 2013-12-20 MED ORDER — MIDAZOLAM HCL 2 MG/2ML IJ SOLN
INTRAMUSCULAR | Status: AC
  Filled 2013-12-20: qty 2

## 2013-12-20 MED ORDER — FENTANYL CITRATE 0.05 MG/ML IJ SOLN
100.0000 ug | Freq: Once | INTRAMUSCULAR | Status: AC
  Administered 2013-12-20: 100 ug via INTRAVENOUS
  Filled 2013-12-20: qty 2

## 2013-12-20 MED ORDER — CEFAZOLIN SODIUM 1-5 GM-% IV SOLN
1.0000 g | Freq: Four times a day (QID) | INTRAVENOUS | Status: AC
  Administered 2013-12-20 (×2): 1 g via INTRAVENOUS
  Filled 2013-12-20 (×2): qty 50

## 2013-12-20 MED ORDER — ONDANSETRON HCL 4 MG/2ML IJ SOLN: 4.0000 mg | Freq: Four times a day (QID) | INTRAMUSCULAR | Status: DC | PRN

## 2013-12-20 MED ORDER — PROPOFOL 10 MG/ML IV BOLUS
INTRAVENOUS | Status: AC
  Filled 2013-12-20: qty 20

## 2013-12-20 MED ORDER — FENTANYL CITRATE 0.05 MG/ML IJ SOLN
INTRAMUSCULAR | Status: AC
  Filled 2013-12-20: qty 2

## 2013-12-20 MED ORDER — ALPRAZOLAM 0.5 MG PO TABS
0.5000 mg | ORAL_TABLET | Freq: Every day | ORAL | Status: DC
  Administered 2013-12-20: 0.5 mg via ORAL
  Filled 2013-12-20: qty 1

## 2013-12-20 MED ORDER — ONDANSETRON HCL 4 MG PO TABS: 4.0000 mg | ORAL_TABLET | Freq: Four times a day (QID) | ORAL | Status: DC | PRN

## 2013-12-20 MED ORDER — BUPIVACAINE LIPOSOME 1.3 % IJ SUSP
INTRAMUSCULAR | Status: DC | PRN
  Administered 2013-12-20: 20 mL

## 2013-12-20 MED ORDER — FLEET ENEMA 7-19 GM/118ML RE ENEM: 1.0000 | ENEMA | Freq: Once | RECTAL | Status: AC | PRN

## 2013-12-20 MED ORDER — METHOCARBAMOL 500 MG PO TABS
500.0000 mg | ORAL_TABLET | Freq: Four times a day (QID) | ORAL | Status: DC | PRN
  Filled 2013-12-20: qty 1

## 2013-12-20 MED ORDER — MENTHOL 3 MG MT LOZG: 1.0000 | LOZENGE | OROMUCOSAL | Status: DC | PRN

## 2013-12-20 MED ORDER — LIDOCAINE HCL (CARDIAC) 20 MG/ML IV SOLN
INTRAVENOUS | Status: DC | PRN
  Administered 2013-12-20: 70 mg via INTRAVENOUS

## 2013-12-20 MED ORDER — PROPOFOL 10 MG/ML IV BOLUS
INTRAVENOUS | Status: DC | PRN
  Administered 2013-12-20: 170 mg via INTRAVENOUS

## 2013-12-20 MED ORDER — BUPIVACAINE LIPOSOME 1.3 % IJ SUSP
20.0000 mL | Freq: Once | INTRAMUSCULAR | Status: DC
  Filled 2013-12-20: qty 20

## 2013-12-20 MED ORDER — ACETAMINOPHEN 650 MG RE SUPP: 650.0000 mg | Freq: Four times a day (QID) | RECTAL | Status: DC | PRN

## 2013-12-20 MED ORDER — BUPIVACAINE-EPINEPHRINE 0.5% -1:200000 IJ SOLN
INTRAMUSCULAR | Status: DC | PRN
  Administered 2013-12-20: 30 mL

## 2013-12-20 MED ORDER — OXYCODONE HCL 5 MG PO TABS: 5.0000 mg | ORAL_TABLET | ORAL | Status: DC | PRN

## 2013-12-20 MED ORDER — LACTATED RINGERS IV SOLN
INTRAVENOUS | Status: DC
  Administered 2013-12-20 (×2): via INTRAVENOUS

## 2013-12-20 MED ORDER — ARTIFICIAL TEARS OP OINT
TOPICAL_OINTMENT | OPHTHALMIC | Status: AC
Start: 2013-12-20 — End: 2013-12-20
  Filled 2013-12-20: qty 3.5

## 2013-12-20 MED ORDER — METHOCARBAMOL 100 MG/ML IJ SOLN
500.0000 mg | Freq: Four times a day (QID) | INTRAVENOUS | Status: DC | PRN
  Filled 2013-12-20: qty 5

## 2013-12-20 MED ORDER — CELECOXIB 200 MG PO CAPS
200.0000 mg | ORAL_CAPSULE | Freq: Two times a day (BID) | ORAL | Status: DC
  Administered 2013-12-20 – 2013-12-21 (×3): 200 mg via ORAL
  Filled 2013-12-20 (×4): qty 1

## 2013-12-20 MED ORDER — PHENOL 1.4 % MT LIQD: 1.0000 | OROMUCOSAL | Status: DC | PRN

## 2013-12-20 MED ORDER — ZOLPIDEM TARTRATE 5 MG PO TABS: 5.0000 mg | ORAL_TABLET | Freq: Every evening | ORAL | Status: DC | PRN

## 2013-12-20 MED ORDER — SODIUM CHLORIDE 0.9 % IV SOLN: INTRAVENOUS | Status: DC

## 2013-12-20 MED ORDER — ACETAMINOPHEN 325 MG PO TABS: 650.0000 mg | ORAL_TABLET | Freq: Four times a day (QID) | ORAL | Status: DC | PRN

## 2013-12-20 MED ORDER — FLUTICASONE PROPIONATE 50 MCG/ACT NA SUSP
2.0000 | Freq: Every day | NASAL | Status: DC
  Filled 2013-12-20: qty 16

## 2013-12-20 MED ORDER — ONDANSETRON HCL 4 MG/2ML IJ SOLN
INTRAMUSCULAR | Status: AC
  Filled 2013-12-20: qty 2

## 2013-12-20 MED ORDER — DIPHENHYDRAMINE HCL 12.5 MG/5ML PO ELIX: 12.5000 mg | ORAL_SOLUTION | ORAL | Status: DC | PRN

## 2013-12-20 MED ORDER — BUPIVACAINE-EPINEPHRINE (PF) 0.5% -1:200000 IJ SOLN
INTRAMUSCULAR | Status: AC
  Filled 2013-12-20: qty 10

## 2013-12-20 MED ORDER — HYDROMORPHONE HCL PF 1 MG/ML IJ SOLN: 0.2500 mg | INTRAMUSCULAR | Status: DC | PRN

## 2013-12-20 MED ORDER — CHLORHEXIDINE GLUCONATE 4 % EX LIQD: 60.0000 mL | Freq: Once | CUTANEOUS | Status: DC

## 2013-12-20 MED ORDER — FENTANYL CITRATE 0.05 MG/ML IJ SOLN
INTRAMUSCULAR | Status: AC
  Filled 2013-12-20: qty 5

## 2013-12-20 MED ORDER — MIDAZOLAM HCL 5 MG/ML IJ SOLN
2.0000 mg | Freq: Once | INTRAMUSCULAR | Status: DC
  Filled 2013-12-20: qty 1

## 2013-12-20 MED ORDER — METOCLOPRAMIDE HCL 5 MG/ML IJ SOLN: 5.0000 mg | Freq: Three times a day (TID) | INTRAMUSCULAR | Status: DC | PRN

## 2013-12-20 MED ORDER — ALUM & MAG HYDROXIDE-SIMETH 200-200-20 MG/5ML PO SUSP: 30.0000 mL | ORAL | Status: DC | PRN

## 2013-12-20 MED ORDER — DEXAMETHASONE SODIUM PHOSPHATE 4 MG/ML IJ SOLN
INTRAMUSCULAR | Status: DC | PRN
  Administered 2013-12-20: 8 mg via INTRAVENOUS

## 2013-12-20 MED ORDER — ONDANSETRON HCL 4 MG/2ML IJ SOLN
INTRAMUSCULAR | Status: DC | PRN
  Administered 2013-12-20: 4 mg via INTRAVENOUS

## 2013-12-20 MED ORDER — PROMETHAZINE HCL 25 MG/ML IJ SOLN: 6.2500 mg | INTRAMUSCULAR | Status: DC | PRN

## 2013-12-20 MED ORDER — FENTANYL CITRATE 0.05 MG/ML IJ SOLN
INTRAMUSCULAR | Status: DC | PRN
  Administered 2013-12-20: 50 ug via INTRAVENOUS

## 2013-12-20 MED ORDER — LISINOPRIL 10 MG PO TABS
10.0000 mg | ORAL_TABLET | Freq: Every day | ORAL | Status: DC
  Administered 2013-12-20 – 2013-12-21 (×2): 10 mg via ORAL
  Filled 2013-12-20 (×2): qty 1

## 2013-12-20 MED ORDER — LEVOTHYROXINE SODIUM 112 MCG PO TABS
112.0000 ug | ORAL_TABLET | Freq: Every day | ORAL | Status: DC
  Administered 2013-12-21: 112 ug via ORAL
  Filled 2013-12-20 (×2): qty 1

## 2013-12-20 MED ORDER — HYDROMORPHONE HCL PF 1 MG/ML IJ SOLN: 1.0000 mg | INTRAMUSCULAR | Status: DC | PRN

## 2013-12-20 MED ORDER — EPHEDRINE SULFATE 50 MG/ML IJ SOLN
INTRAMUSCULAR | Status: AC
  Filled 2013-12-20: qty 1

## 2013-12-20 MED ORDER — ENOXAPARIN SODIUM 30 MG/0.3ML ~~LOC~~ SOLN
30.0000 mg | Freq: Two times a day (BID) | SUBCUTANEOUS | Status: DC
  Administered 2013-12-21: 30 mg via SUBCUTANEOUS
  Filled 2013-12-20 (×3): qty 0.3

## 2013-12-20 MED ORDER — ROPIVACAINE HCL 5 MG/ML IJ SOLN
INTRAMUSCULAR | Status: DC | PRN
  Administered 2013-12-20: 30 mL via PERINEURAL

## 2013-12-20 MED ORDER — MIDAZOLAM HCL 2 MG/2ML IJ SOLN
INTRAMUSCULAR | Status: AC
  Administered 2013-12-20: 2 mg
  Filled 2013-12-20: qty 2

## 2013-12-20 MED ORDER — ARTIFICIAL TEARS OP OINT
TOPICAL_OINTMENT | OPHTHALMIC | Status: DC | PRN
  Administered 2013-12-20: 1 via OPHTHALMIC

## 2013-12-20 MED ORDER — EPHEDRINE SULFATE 50 MG/ML IJ SOLN
INTRAMUSCULAR | Status: DC | PRN
  Administered 2013-12-20 (×3): 5 mg via INTRAVENOUS

## 2013-12-20 MED ORDER — SENNOSIDES-DOCUSATE SODIUM 8.6-50 MG PO TABS
1.0000 | ORAL_TABLET | Freq: Every evening | ORAL | Status: DC | PRN
Start: 2013-12-20 — End: 2013-12-21

## 2013-12-20 MED ORDER — BISACODYL 5 MG PO TBEC: 5.0000 mg | DELAYED_RELEASE_TABLET | Freq: Every day | ORAL | Status: DC | PRN

## 2013-12-20 MED ORDER — METOCLOPRAMIDE HCL 10 MG PO TABS: 5.0000 mg | ORAL_TABLET | Freq: Three times a day (TID) | ORAL | Status: DC | PRN

## 2013-12-20 MED ORDER — 0.9 % SODIUM CHLORIDE (POUR BTL) OPTIME
TOPICAL | Status: DC | PRN
  Administered 2013-12-20: 1000 mL
  Administered 2013-12-20: 3000 mL

## 2013-12-20 SURGICAL SUPPLY — 57 items
BAG URINE DRAINAGE (UROLOGICAL SUPPLIES) IMPLANT
BANDAGE ESMARK 6X9 LF (GAUZE/BANDAGES/DRESSINGS) ×1 IMPLANT
BLADE SURG 10 STRL SS (BLADE) ×6 IMPLANT
BNDG ESMARK 6X9 LF (GAUZE/BANDAGES/DRESSINGS) ×2
BOWL SMART MIX CTS (DISPOSABLE) ×2 IMPLANT
CATH KIT ON Q 5IN SLV (PAIN MANAGEMENT) IMPLANT
CLOTH BEACON ORANGE TIMEOUT ST (SAFETY) ×2 IMPLANT
CONT SPEC 4OZ CLIKSEAL STRL BL (MISCELLANEOUS) ×4 IMPLANT
COVER SURGICAL LIGHT HANDLE (MISCELLANEOUS) ×2 IMPLANT
CUFF TOURNIQUET SINGLE 34IN LL (TOURNIQUET CUFF) ×2 IMPLANT
DRAPE EXTREMITY T 121X128X90 (DRAPE) ×2 IMPLANT
DRAPE INCISE IOBAN 66X45 STRL (DRAPES) IMPLANT
DRAPE PROXIMA HALF (DRAPES) ×2 IMPLANT
DRAPE U-SHAPE 47X51 STRL (DRAPES) ×2 IMPLANT
DRSG ADAPTIC 3X8 NADH LF (GAUZE/BANDAGES/DRESSINGS) ×2 IMPLANT
DRSG PAD ABDOMINAL 8X10 ST (GAUZE/BANDAGES/DRESSINGS) ×2 IMPLANT
DURAPREP 26ML APPLICATOR (WOUND CARE) ×4 IMPLANT
ELECT REM PT RETURN 9FT ADLT (ELECTROSURGICAL) ×2
ELECTRODE REM PT RTRN 9FT ADLT (ELECTROSURGICAL) ×1 IMPLANT
EVACUATOR 1/8 PVC DRAIN (DRAIN) ×4 IMPLANT
GLOVE BIOGEL PI IND STRL 7.5 (GLOVE) IMPLANT
GLOVE BIOGEL PI IND STRL 8.5 (GLOVE) ×2 IMPLANT
GLOVE BIOGEL PI INDICATOR 7.5 (GLOVE)
GLOVE BIOGEL PI INDICATOR 8.5 (GLOVE) ×2
GLOVE SURG ORTHO 7.0 STRL STRW (GLOVE) IMPLANT
GLOVE SURG ORTHO 8.0 STRL STRW (GLOVE) ×4 IMPLANT
GOWN PREVENTION PLUS XLARGE (GOWN DISPOSABLE) ×2 IMPLANT
GOWN STRL NON-REIN LRG LVL3 (GOWN DISPOSABLE) ×4 IMPLANT
HANDPIECE INTERPULSE COAX TIP (DISPOSABLE) ×1
HOOD PEEL AWAY FACE SHEILD DIS (HOOD) ×6 IMPLANT
KIT BASIN OR (CUSTOM PROCEDURE TRAY) ×2 IMPLANT
KIT ROOM TURNOVER OR (KITS) ×2 IMPLANT
MANIFOLD NEPTUNE II (INSTRUMENTS) ×2 IMPLANT
NEEDLE 22X1 1/2 (OR ONLY) (NEEDLE) ×2 IMPLANT
NS IRRIG 1000ML POUR BTL (IV SOLUTION) ×2 IMPLANT
PACK TOTAL JOINT (CUSTOM PROCEDURE TRAY) ×2 IMPLANT
PAD ABD 8X10 STRL (GAUZE/BANDAGES/DRESSINGS) ×2 IMPLANT
PAD ARMBOARD 7.5X6 YLW CONV (MISCELLANEOUS) ×4 IMPLANT
PADDING CAST COTTON 6X4 STRL (CAST SUPPLIES) ×2 IMPLANT
PLATE ROT INSERT 10MM SIZE 2.5 (Plate) ×2 IMPLANT
SET HNDPC FAN SPRY TIP SCT (DISPOSABLE) ×1 IMPLANT
SPONGE GAUZE 4X4 12PLY (GAUZE/BANDAGES/DRESSINGS) ×2 IMPLANT
SPONGE GAUZE 4X4 12PLY STER LF (GAUZE/BANDAGES/DRESSINGS) ×2 IMPLANT
STAPLER VISISTAT 35W (STAPLE) ×2 IMPLANT
SUCTION FRAZIER TIP 10 FR DISP (SUCTIONS) ×2 IMPLANT
SUT PDS AB 1 CTXB1 36 (SUTURE) ×4 IMPLANT
SUT VIC AB 0 CTB1 27 (SUTURE) ×4 IMPLANT
SUT VIC AB 1 CT1 27 (SUTURE) ×1
SUT VIC AB 1 CT1 27XBRD ANBCTR (SUTURE) ×1 IMPLANT
SUT VIC AB 2-0 CT1 27 (SUTURE) ×2
SUT VIC AB 2-0 CT1 TAPERPNT 27 (SUTURE) ×2 IMPLANT
SYR 20CC LL (SYRINGE) ×4 IMPLANT
SYRINGE 10CC LL (SYRINGE) ×2 IMPLANT
TOWEL OR 17X24 6PK STRL BLUE (TOWEL DISPOSABLE) ×2 IMPLANT
TOWEL OR 17X26 10 PK STRL BLUE (TOWEL DISPOSABLE) ×2 IMPLANT
TRAY FOLEY CATH 16FRSI W/METER (SET/KITS/TRAYS/PACK) IMPLANT
WATER STERILE IRR 1000ML POUR (IV SOLUTION) ×2 IMPLANT

## 2013-12-20 NOTE — Anesthesia Postprocedure Evaluation (Signed)
Anesthesia Post Note  Patient: Anne Pearson  Procedure(s) Performed: Procedure(s) (LRB): IRRIGATION AND DEBRIDEMENT KNEE WITH POLY EXCHANGE (Left)  Anesthesia type: general  Patient location: PACU  Post pain: Pain level controlled  Post assessment: Patient's Cardiovascular Status Stable  Last Vitals:  Filed Vitals:   12/20/13 1355  BP: 134/66  Pulse: 65  Temp:   Resp: 12    Post vital signs: Reviewed and stable  Level of consciousness: sedated  Complications: No apparent anesthesia complications

## 2013-12-20 NOTE — Transfer of Care (Signed)
Immediate Anesthesia Transfer of Care Note  Patient: Anne Pearson  Procedure(s) Performed: Procedure(s): IRRIGATION AND DEBRIDEMENT KNEE WITH POLY EXCHANGE (Left)  Patient Location: PACU  Anesthesia Type:General  Level of Consciousness: awake, alert  and oriented  Airway & Oxygen Therapy: Patient Spontanous Breathing and Patient connected to nasal cannula oxygen  Post-op Assessment: Report given to PACU RN  Post vital signs: Reviewed and stable  Complications: No apparent anesthesia complications

## 2013-12-20 NOTE — Progress Notes (Signed)
Orthopedic Tech Progress Note Patient Details:  Anne Pearson May 08, 1941 [id number] CPM applied to Left LE with appropriate settings. OHF applied to bed. Footsie roll provided.  CPM Left Knee CPM Left Knee: On Left Knee Flexion (Degrees): 90 Left Knee Extension (Degrees): 0   Asia R Thompson 12/20/2013, 2:41 PM

## 2013-12-20 NOTE — Preoperative (Signed)
Beta Blockers   Reason not to administer Beta Blockers:Not Applicable 

## 2013-12-20 NOTE — Op Note (Signed)
Dictation Number:  [id number]

## 2013-12-20 NOTE — Anesthesia Procedure Notes (Addendum)
Anesthesia Regional Block:  Adductor canal block  Pre-Anesthetic Checklist: ,, timeout performed, Correct Patient, Correct Site, Correct Laterality, Correct Procedure, Correct Position, site marked, Risks and benefits discussed,  Surgical consent,  Pre-op evaluation,  At surgeon's request and post-op pain management  Laterality: Left and Upper  Prep: chloraprep       Needles:   Needle Type: Echogenic Needle      Needle Gauge: 22 and 22 G  Needle insertion depth: 5 cm   Additional Needles:  Procedures: ultrasound guided (picture in chart) Adductor canal block Narrative:  Start time: 12/20/2013 10:35 AM End time: 12/20/2013 10:50 AM Injection made incrementally with aspirations every 5 mL.  Performed by: Personally  Anesthesiologist: T Massagee  Additional Notes: Monitor on, sedation begn, tolerated well   Procedure Name: LMA Insertion Date/Time: 12/20/2013 11:40 AM Performed by: Sampson Si E Pre-anesthesia Checklist: Patient identified, Emergency Drugs available, Suction available, Patient being monitored and Timeout performed Patient Re-evaluated:Patient Re-evaluated prior to inductionOxygen Delivery Method: Circle system utilized Preoxygenation: Pre-oxygenation with 100% oxygen Intubation Type: IV induction LMA: LMA inserted LMA Size: 4.0 Number of attempts: 1 Placement Confirmation: positive ETCO2 and breath sounds checked- equal and bilateral Tube secured with: Tape Dental Injury: Teeth and Oropharynx as per pre-operative assessment

## 2013-12-20 NOTE — Anesthesia Preprocedure Evaluation (Signed)
Anesthesia Evaluation  Patient identified by MRN, date of birth, ID band Patient awake    Reviewed: Allergy & Precautions, H&P , NPO status , Patient's Chart, lab work & pertinent test results  History of Anesthesia Complications (+) PONV  Airway Mallampati: I TM Distance: >3 FB Neck ROM: full    Dental  (+) Teeth Intact and Dental Advisory Given   Pulmonary neg pulmonary ROS, former smoker,  CHEST - 2 VIEW 12/31/12   Comparison: None.   Findings: The heart pulmonary vascularity are within normal limits. The lungs are well-aerated bilaterally without focal infiltrate. No acute bony abnormality is seen.   IMPRESSION: No acute abnormality noted.    breath sounds clear to auscultation  Pulmonary exam normal       Cardiovascular Exercise Tolerance: Good hypertension, Pt. 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     Plan Discussed with:   Anesthesia Plan Comments:         Anesthesia Quick  Evaluation

## 2013-12-20 NOTE — H&P (Signed)
  Anne Pearson MRN:  [id number] DOB/SEX:  [birth year]/female  CHIEF COMPLAINT:  Painful left Knee  HISTORY: Patient is a 73 y.o. female presented with a history of pain in the left knee. Onset of symptoms was gradual starting several months ago with gradually worsening course since that time. Prior procedures on the knee include arthroplasty. Patient has been treated conservatively with over-the-counter NSAIDs and activity modification. Patient currently rates pain in the knee at 9 out of 10 with activity. There is pain at night.  PAST MEDICAL HISTORY: There are no active problems to display for this patient.  Past Medical History  Diagnosis Date  . Hypertension   . Hypothyroidism   . Arthritis   . PONV (postoperative nausea and vomiting)    Past Surgical History  Procedure Laterality Date  . Abdominal hysterectomy  1988  . Tonsillectomy  1947  . Bladder suspension  2011  . Joint replacement Left 2012  . Partial knee arthroplasty Right 01/04/2013    Procedure: PARTIAL KNEE REPLACEMENT/UNICOMPARTMENTAL MEDIAL;  Surgeon: Vickey Huger, MD;  Location: [place];  Service: Orthopedics;  Laterality: Right;  . Appendectomy    . Colonoscopy       MEDICATIONS:   No prescriptions prior to admission    ALLERGIES:   Allergies  Allergen Reactions  . Macrodantin [Nitrofurantoin] Other (See Comments)    Caused hepatitis   . Codeine Nausea And Vomiting  . Meloxicam Rash    REVIEW OF SYSTEMS:  Pertinent items are noted in HPI.   FAMILY HISTORY:  No family history on file.  SOCIAL HISTORY:   History  Substance Use Topics  . Smoking status: Former Smoker -- 1.00 packs/day for 30 years    Types: Cigarettes    Quit date: 11/11/1997  . Smokeless tobacco: Never Used  . Alcohol Use: Yes     Comment: social     EXAMINATION:  Vital signs in last 24 hours:    General appearance: alert, cooperative and no distress Lungs: clear to auscultation bilaterally Heart: regular rate and  rhythm, S1, S2 normal, no murmur, click, rub or gallop Abdomen: soft, non-tender; bowel sounds normal; no masses,  no organomegaly Extremities: extremities normal, atraumatic, no cyanosis or edema and Homans sign is negative, no sign of DVT Pulses: 2+ and symmetric Skin: Skin color, texture, turgor normal. No rashes or lesions Neurologic: Alert and oriented X 3, normal strength and tone. Normal symmetric reflexes. Normal coordination and gait  Musculoskeletal:  ROM 0-90, Ligaments intact,  Imaging Review Plain radiographs demonstrate components intact  of the left knee. The overall alignment is neutral. The bone quality appears to be good for age and reported activity level.  Assessment/Plan:  left knee pain  The patient history, physical examination and imaging studies are normal in the left knee. The patient has failed conservative treatment.  The clearance notes were reviewed.  After discussion with the patient it was felt that a poly exchange was indicated. The procedure,  risks, and benefits of total knee arthroplasty were presented and reviewed.The patient acknowledged the explanation, agreed to proceed with the plan.  Ahmed Inniss 12/20/2013, 6:43 AM

## 2013-12-20 NOTE — Progress Notes (Signed)
Utilization review completed.  

## 2013-12-21 LAB — BASIC METABOLIC PANEL
BUN: 21 mg/dL (ref 6–23)
CO2: 23 mEq/L (ref 19–32)
Calcium: 8.5 mg/dL (ref 8.4–10.5)
Chloride: 106 mEq/L (ref 96–112)
Creatinine, Ser: 0.61 mg/dL (ref 0.50–1.10)
GFR calc Af Amer: >90 mL/min (ref >90)
GFR, EST NON AFRICAN AMERICAN: 90 mL/min — AB (ref >90)
Glucose, Bld: 118 mg/dL — ABNORMAL HIGH (ref 70–99)
POTASSIUM: 4.7 meq/L (ref 3.7–5.3)
SODIUM: 142 meq/L (ref 137–147)

## 2013-12-21 LAB — CBC
HCT: 36.2 % (ref 36.0–46.0)
Hemoglobin: 12 g/dL (ref 12.0–15.0)
MCH: 29.6 pg (ref 26.0–34.0)
MCHC: 33.1 g/dL (ref 30.0–36.0)
MCV: 89.2 fL (ref 78.0–100.0)
Platelets: 170 10*3/uL (ref 150–400)
RBC: 4.06 MIL/uL (ref 3.87–5.11)
RDW: 12.6 % (ref 11.5–15.5)
WBC: 8.5 10*3/uL (ref 4.0–10.5)

## 2013-12-21 MED ORDER — METHOCARBAMOL 500 MG PO TABS
500.0000 mg | ORAL_TABLET | Freq: Four times a day (QID) | ORAL | Status: DC | PRN
Start: 2013-12-21 — End: 2020-01-26

## 2013-12-21 MED ORDER — ENOXAPARIN SODIUM 40 MG/0.4ML ~~LOC~~ SOLN: 40.0000 mg | SUBCUTANEOUS | Status: DC

## 2013-12-21 MED ORDER — HYDROCODONE-ACETAMINOPHEN 5-325 MG PO TABS: 1.0000 | ORAL_TABLET | ORAL | Status: DC | PRN

## 2013-12-21 NOTE — Progress Notes (Signed)
SPORTS MEDICINE AND JOINT REPLACEMENT  Lara Mulch, MD   Carlynn Spry, PA-C [place], [place], [place]  [address]                             [phone]   PROGRESS NOTE  Subjective:  negative for Chest Pain  negative for Shortness of Breath  negative for Nausea/Vomiting   negative for Calf Pain  negative for Bowel Movement   Tolerating Diet: yes         Patient reports pain as 0 on 0-10 scale.    Objective: Vital signs in last 24 hours:   Patient Vitals for the past 24 hrs:  BP Temp Temp src Pulse Resp SpO2  12/21/13 0500 128/71 mmHg 98.1 F (36.7 C) Oral 75 18 99 %  12/21/13 0400 - - - - 17 -  12/21/13 0205 118/62 mmHg 98.8 F (37.1 C) Oral 86 18 98 %  12/21/13 0000 - - - - 16 99 %  12/20/13 2009 123/54 mmHg 99.4 F (37.4 C) Oral 76 18 99 %  12/20/13 2000 - - - - 16 -  12/20/13 1512 131/82 mmHg 97.5 F (36.4 C) - 74 18 100 %  12/20/13 1440 121/61 mmHg - - 68 16 100 %  12/20/13 1435 - - - 85 18 100 %  12/20/13 1430 131/58 mmHg - - 67 12 100 %  12/20/13 1415 128/71 mmHg - - 73 17 100 %  12/20/13 1355 134/66 mmHg - - 65 12 100 %  12/20/13 1340 121/25 mmHg - - 73 18 100 %  12/20/13 1325 - - - 78 20 100 %  12/20/13 1322 134/49 mmHg 97.6 F (36.4 C) - - - -  12/20/13 1116 152/70 mmHg - - 61 14 100 %  12/20/13 1115 160/73 mmHg - - 61 16 100 %  12/20/13 1107 156/54 mmHg - - 64 17 100 %  12/20/13 1105 - - - 50 10 100 %  12/20/13 1101 142/53 mmHg - - 55 11 100 %  12/20/13 1100 - - - 47 12 100 %  12/20/13 1058 - - - 61 29 97 %  12/20/13 1057 - - - 52 12 100 %  12/20/13 1056 - - - 51 14 99 %  12/20/13 1055 140/46 mmHg - - 47 11 100 %  12/20/13 1054 - - - 47 13 100 %  12/20/13 1053 - - - 58 18 100 %  12/20/13 1051 - - - 49 12 100 %  12/20/13 1050 133/63 mmHg - - 52 12 100 %  12/20/13 1048 - - - 53 13 100 %  12/20/13 1046 - - - 61 17 100 %  12/20/13 1045 154/66 mmHg - - 57 11 100 %  12/20/13 1043 167/67 mmHg - - 61 13 100 %  12/20/13 1040 - - - 60 13  100 %  12/20/13 1035 180/80 mmHg - - 71 14 100 %  12/20/13 1030 150/69 mmHg - - 58 12 100 %  12/20/13 1025 164/66 mmHg - - 58 12 100 %  12/20/13 1023 - - - 58 12 100 %  12/20/13 1021 174/63 mmHg - - 62 16 100 %  12/20/13 1020 174/63 mmHg - - 60 13 100 %  12/20/13 1015 178/59 mmHg - - 57 14 100 %  12/20/13 0837 158/63 mmHg 97.7 F (36.5 C) Oral 58 20 100 %    @flow {1959:LAST@   Intake/Output  from previous day:   02/09 0701 - 02/10 0700 In: 2535 [P.O.:460; I.V.:1975] Out: 1200 [Urine:1000; Drains:200]   Intake/Output this shift:       Intake/Output     02/09 0701 - 02/10 0700 02/10 0701 - 02/11 0700   P.O. 460    I.V. 1975    IV Piggyback 100    Total Intake 2535     Urine 1000    Drains 200    Total Output 1200     Net +1335          Urine Occurrence 5 x       LABORATORY DATA:  Recent Labs  12/20/13 1530 12/21/13 0503  WBC 5.9 8.5  HGB 13.0 12.0  HCT 39.4 36.2  PLT 161 170    Recent Labs  12/20/13 1530 12/21/13 0503  NA  --  142  K  --  4.7  CL  --  106  CO2  --  23  BUN  --  21  CREATININE 0.60 0.61  GLUCOSE  --  118*  CALCIUM  --  8.5   Lab Results  Component Value Date   INR 0.96 12/13/2013   INR 0.94 12/31/2012    Examination:  General appearance: alert, cooperative and no distress Extremities: Homans sign is negative, no sign of DVT  Wound Exam: clean, dry, intact   Drainage:  None: wound tissue dry  Motor Exam: EHL and FHL Intact  Sensory Exam: Deep Peroneal normal   Assessment:    1 Day Post-Op  Procedure(s) (LRB): IRRIGATION AND DEBRIDEMENT KNEE WITH POLY EXCHANGE (Left)  ADDITIONAL DIAGNOSIS:  Active Problems:   Left knee pain  Acute Blood Loss Anemia   Plan: Physical Therapy as ordered Weight Bearing as Tolerated (WBAT)  DVT Prophylaxis:  Lovenox  DISCHARGE PLAN: Home  DISCHARGE NEEDS: HHPT, CPM, Walker and 3-in-1 comode seat         Haroldine Redler 12/21/2013, 7:42 AM

## 2013-12-21 NOTE — Plan of Care (Signed)
Problem: Consults Goal: Diagnosis- Total Joint Replacement I&D with poly exchange

## 2013-12-21 NOTE — Progress Notes (Signed)
Spoke with PT and the pt about OT services. This was a wash out of pt's knee and she is very familiar with all OT adl techniques and feels she is not in need of OT services at this time. Pt was in bathroom at time of arrival doing all toileting Ily.  PT is in agreement., Jinger Neighbors, OTR/L [phone]

## 2013-12-21 NOTE — Progress Notes (Signed)
Physical Therapy Treatment Patient Details Name: Anne Pearson MRN: [id number] DOB: 1941-02-17 Today's Date: 12/21/2013 Time: 1320-1350 PT Time Calculation (min): 30 min  PT Assessment / Plan / Recommendation  History of Present Illness Pt with history of 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  Pt now in for cleaning out of left TKR.  Pt lives independent lifestyle.  lives iwth husband   PT Comments   Pt doing great.  Hoping to DC home later today.  Pt  With -10 to 95 degrees active ROM sitting.  Pt walking with no problems with RW.  All acute goals met.  Pt given acute HEP and I reviewed it with her.  Educated her on pain control measures at home.  Follow Up Recommendations  Home health PT;Outpatient PT (Pt to talk to MD about [place] versus OP. she wants to start OP soon)     Does the patient have the potential to tolerate intense rehabilitation     Barriers to Discharge        Equipment Recommendations  None recommended by PT    Recommendations for Other Services    Frequency 7X/week   Progress towards PT Goals Progress towards PT goals: Progressing toward goals  Plan Current plan remains appropriate    Precautions / Restrictions Precautions Precautions: Knee Restrictions Weight Bearing Restrictions: Yes LLE Weight Bearing: Weight bearing as tolerated   Pertinent Vitals/Pain Pt with 2-3/10 pain all day today WITHOUT any pain meds.  I talked to her about pain control measures at home    Mobility  Bed Mobility Overal bed mobility: Independent Transfers Overall transfer level: Independent Ambulation/Gait Ambulation/Gait assistance: Modified independent (Device/Increase time) Ambulation Distance (Feet): 200 Feet Assistive device: Rolling walker (2 wheeled) Gait Pattern/deviations: Step-through pattern General Gait Details: did verbal education of using cane  with ambulation Stairs:  (pt didnt feel she needed to do.  again verbal review.  pt has rail at home)    Exercises Total Joint Exercises Ankle Circles/Pumps: AROM;Both;10 reps Long Arc Quad: AROM;Left;5 reps;Seated Goniometric ROM: Pt active ROM -10 to 95 degrees   PT Diagnosis: Abnormality of gait;Acute pain;Generalized weakness  PT Problem List: Decreased strength;Decreased range of motion;Decreased activity tolerance;Pain PT Treatment Interventions: Gait training;Stair training;Functional mobility training;Therapeutic activities;Therapeutic exercise;Patient/family education;DME instruction   PT Goals (current goals can now be found in the care plan section) Acute Rehab PT Goals Patient Stated Goal: to get home and do well following cleaning out of TKR PT Goal Formulation: With patient Potential to Achieve Goals: Good  Visit Information  Last PT Received On: 12/21/13 Assistance Needed: +1 History of Present Illness: Pt with history of 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  Pt now in for cleaning out of left TKR.  Pt lives independent lifestyle.  lives iwth husband    Subjective Data  Patient Stated Goal: to get home and do well following cleaning out of TKR   Cognition  Cognition Arousal/Alertness: Awake/alert Behavior During Therapy: WFL for tasks assessed/performed Overall Cognitive Status: Within Functional Limits for tasks assessed    Balance  Balance Overall balance assessment: No apparent balance deficits (not formally assessed)  End of Session PT - End of Session Equipment Utilized During Treatment: Gait belt Activity Tolerance: Patient tolerated treatment well Patient  left: in CPM;in bed;with call bell/phone within reach CPM Left Knee CPM Left Knee: On Left Knee Flexion (Degrees): 85 Additional Comments: Pt shown how to increase her knee flexion -  she wanted to start off slow and increase   GP     Loyal Buba 12/21/2013, 3:29 PM  Rande Lawman, PT

## 2013-12-21 NOTE — Op Note (Signed)
NAMEMURREL, FREET NO.:  [id number]  MEDICAL RECORD NO.:  [id number]  LOCATION:  [id number]                        FACILITY:  [facility]  PHYSICIAN:  Estill Bamberg. Ronnie Derby, M.D. DATE OF BIRTH:  April 13, 1941  DATE OF PROCEDURE:  12/20/2013 DATE OF DISCHARGE:                              OPERATIVE REPORT   SURGEON:  Estill Bamberg. Ronnie Derby, M.D.  ASSISTANT:  Nehemiah Massed, PA-C.  ANESTHESIA:  General.  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  PROCEDURE:  Left knee revision with (__________) with polyethylene exchange and open synovectomy.  INDICATION FOR PROCEDURE:  The patient is a 73 year old white female, well over a year out from a left knee replacement with __________ synovial swelling which has been painful and she has failed conservative measures.  Informed consent was obtained.  DESCRIPTION OF PROCEDURE:  The patient was laid supine under general anesthesia.  Left leg was prepped and draped in usual fashion.  Old incision was used __________ #10 blade.  New blade was used to make a median parapatellar arthrotomy.  I then performed an aggressive synovectomy removing a lot of very thick scar tissue __________ cleanly. I then removed the polyethylene __________ got to the back of the knee, removed that as well.  I felt the knee replacement was balanced.  I then placed a clean polyethylene on the same __________ lavaged and closed with interrupted #1 Vicryl sutures, 0 Vicryl sutures __________ skin staples.  Dressed with Xeroform, dressing sponges, sterile Webril, Ace wrap.  I did leave a Hemovac deep to the arthrotomy.  I did put 30 mL of Exparel with 0.5% Marcaine in the wound as well.  ESTIMATED BLOOD LOSS:  Minimal.          ______________________________ Estill Bamberg. Ronnie Derby, M.D.     SDL/MEDQ  D:  12/20/2013  T:  12/21/2013  Job:   [id number]

## 2013-12-21 NOTE — Progress Notes (Signed)
Pt discharged to home accompanied by husband. Discharge instructions and rx given and explained and pt stated understanding. IV was removed. Pt left unit in a stable condition via wheelchair.

## 2013-12-21 NOTE — Care Management Note (Signed)
CARE MANAGEMENT NOTE 12/21/2013  Patient:  Anne Pearson, Anne Pearson   Account Number:  [id number]  Date Initiated:  12/21/2013  Documentation initiated by:  Ricki Miller  Subjective/Objective Assessment:   73 yr old female s/p removal of scar tissue left total knee     Action/Plan:   Patient preoperatively stup with [facility], no changes. Has her rolling walker and 3in1. Orders have been faxed to [facility] agency.   Anticipated DC Date:  12/21/2013   Anticipated DC Plan:  [place]  CM consult      [facility] Choice  HOME HEALTH   Choice offered to / List presented to:  C-1 Patient        [place] arranged  HH-2 PT      [facility]   Status of service:  Completed, signed off Medicare Important Message given?   (If response is "NO", the following Medicare IM given date fields will be blank) Date Medicare IM given:   Date Additional Medicare IM given:    Discharge Disposition:  [place]

## 2013-12-21 NOTE — Evaluation (Signed)
Physical Therapy Evaluation Patient Details Name: Anne Pearson MRN: [id number] DOB: February 26, 1941 Today's Date: 12/21/2013 Time: 1133-1205 PT Time Calculation (min): 32 min  PT Assessment / Plan / Recommendation History of Present Illness  Pt with history of 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  Pt now in for cleaning out of left TKR.  Pt lives independent lifestyle.  lives iwth husband  Clinical Impression  Pt doing great after surgery.  She has 3/10 knee pain and not taking any pain meds.  Pt walked 200 feet with walker with modified independence.  Active left knee ROM grossly -10 to 80 degrees.  Will see pt again this afternoon and anticipate DC later today with acute PT goals met.    PT Assessment  Patient needs continued PT services    Follow Up Recommendations  Home health PT    Does the patient have the potential to tolerate intense rehabilitation      Barriers to Discharge        Equipment Recommendations  None recommended by PT    Recommendations for Other Services     Frequency 7X/week    Precautions / Restrictions Precautions Precautions: Knee   Pertinent Vitals/Pain Pt with 3/10 pain with no pain meds.  She is pleased with pain relief as she said she was hurting so much prior to surgery.  Discussed use of pain meds once home      Mobility  Bed Mobility Overal bed mobility: Independent Transfers Overall transfer level: Independent Ambulation/Gait Ambulation/Gait assistance: Modified independent (Device/Increase time) Assistive device: Rolling walker (2 wheeled) Gait Pattern/deviations: Step-to pattern Stairs:  (reviewed step technique verbally during eval)    Exercises     PT Diagnosis: Abnormality of gait;Acute pain;Generalized weakness  PT Problem List: Decreased strength;Decreased range of motion;Decreased activity tolerance;Pain PT  Treatment Interventions: Gait training;Stair training;Functional mobility training;Therapeutic activities;Therapeutic exercise;Patient/family education;DME instruction     PT Goals(Current goals can be found in the care plan section) Acute Rehab PT Goals Patient Stated Goal: to get home and do well following cleaning out of TKR PT Goal Formulation: With patient Potential to Achieve Goals: Good  Visit Information  Assistance Needed: +1 History of Present Illness: Pt with history of 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  Pt now in for cleaning out of left TKR.  Pt lives independent lifestyle.  lives iwth husband       Prior Functioning  Home Living Additional Comments: pt says she doesnt think she will need shower seat    Cognition  Cognition Arousal/Alertness: Awake/alert Behavior During Therapy: WFL for tasks assessed/performed Overall Cognitive Status: Within Functional Limits for tasks assessed    Extremity/Trunk Assessment Upper Extremity Assessment Upper Extremity Assessment: Overall WFL for tasks assessed Lower Extremity Assessment Lower Extremity Assessment: RLE deficits/detail RLE Deficits / Details: Right knee WFL AROM.  Left knee with AROM sitting to -10 degrees extension and 80 degrees flexion x 3 reps   Balance Balance Overall balance assessment: No apparent balance deficits (not formally assessed)  End of Session PT - End of Session Equipment Utilized During Treatment: Gait belt Activity Tolerance: Patient tolerated treatment well Patient left: in chair;with call bell/phone within reach  GP     Loyal Buba 12/21/2013, 12:18 PM  Rande Lawman, PT

## 2013-12-21 NOTE — Discharge Instructions (Signed)
Diet: As you were doing prior to hospitalization   Activity:  Increase activity slowly as tolerated                  No lifting or driving for 6 weeks  Shower:  May shower without a dressing once there is no drainage from your wound.                 Do NOT wash over the wound.                 Dressing:  You may change your dressing on Wednesday                    Then change the dressing daily with sterile 4"x4"s gauze dressing                     And TED hose for knees.  Weight Bearing:  Weight bearing as tolerated as taught in physical therapy.  Use a                                walker or Crutches as instructed.  To prevent constipation: you may use a stool softener such as -               Colace ( over the counter) 100 mg by mouth twice a day                Drink plenty of fluids ( prune juice may be helpful) and high fiber foods                Miralax ( over the counter) for constipation as needed.    Precautions:  If you experience chest pain or shortness of breath - call 911 immediately               For transfer to the hospital emergency department!!               If you develop a fever greater that 101 F, purulent drainage from wound,                             increased redness or drainage from wound, or calf pain -- Call the office.  Follow- Up Appointment:  Please call for an appointment to be seen on 01/04/14                                              [facility] office:  [phone]            [address] [place], [place] [address]

## 2013-12-22 NOTE — Discharge Summary (Signed)
SPORTS MEDICINE & JOINT REPLACEMENT   Lara Mulch, MD   Carlynn Spry, PA-C [place], [place], [place]  [address]                             [phone]  PATIENT ID: Anne Pearson        MRN:  [id number]          DOB/AGE: 12-Mar-1941 / 73 y.o.    DISCHARGE SUMMARY  ADMISSION DATE:    12/20/2013 DISCHARGE DATE:  12/21/2013  ADMISSION DIAGNOSIS: left total knee synovitis, imbalance    DISCHARGE DIAGNOSIS:  left total knee synovitis, imbalance    ADDITIONAL DIAGNOSIS: Active Problems:   Left knee pain  Past Medical History  Diagnosis Date  . Hypertension   . Hypothyroidism   . Arthritis   . PONV (postoperative nausea and vomiting)     PROCEDURE: Procedure(s): IRRIGATION AND DEBRIDEMENT KNEE WITH POLY EXCHANGE on 12/20/2013  CONSULTS:     HISTORY:  See H&P in chart  HOSPITAL COURSE:  Peace Noyes is a 73 y.o. admitted on 12/20/2013 and found to have a diagnosis of left total knee synovitis, imbalance.  After appropriate laboratory studies were obtained  they were taken to the operating room on 12/20/2013 and underwent Procedure(s): IRRIGATION AND DEBRIDEMENT KNEE WITH POLY EXCHANGE.   They were given perioperative antibiotics:  Anti-infectives   Start     Dose/Rate Route Frequency Ordered Stop   12/20/13 1500  ceFAZolin (ANCEF) IVPB 1 g/50 mL premix     1 g 100 mL/hr over 30 Minutes Intravenous Every 6 hours 12/20/13 1458 12/20/13 2229   12/20/13 0600  ceFAZolin (ANCEF) IVPB 2 g/50 mL premix     2 g 100 mL/hr over 30 Minutes Intravenous On call to O.R. 12/19/13 1428 12/20/13 1145    .  Tolerated the procedure well.  Placed with a foley intraoperatively.  Given Ofirmev at induction and for 48 hours.    POD# 1: Vital signs were stable.  Patient denied Chest pain, shortness of breath, or calf pain.  Patient was started on Lovenox 30 mg subcutaneously twice daily at 8am.  Consults to PT, OT, and care management were made.  The patient was weight bearing as  tolerated.  CPM was placed on the operative leg 0-90 degrees for 6-8 hours a day.  Incentive spirometry was taught.  Dressing was changed.  Marcaine pump and hemovac were discontinued.      POD #2, Continued  PT for ambulation and exercise program.  IV saline locked.  O2 discontinued.    The remainder of the hospital course was dedicated to ambulation and strengthening.   The patient was discharged on 1 day post op in  Good condition.  Blood products given:none  DIAGNOSTIC STUDIES: Recent vital signs: Patient Vitals for the past 24 hrs:  BP Temp Temp src Pulse Resp SpO2  12/21/13 1501 120/52 mmHg 97.9 F (36.6 C) Oral 68 18 99 %       Recent laboratory studies:  Recent Labs  12/20/13 1530 12/21/13 0503  WBC 5.9 8.5  HGB 13.0 12.0  HCT 39.4 36.2  PLT 161 170    Recent Labs  12/20/13 1530 12/21/13 0503  NA  --  142  K  --  4.7  CL  --  106  CO2  --  23  BUN  --  21  CREATININE 0.60 0.61  GLUCOSE  --  118*  CALCIUM  --  8.5   Lab Results  Component Value Date   INR 0.96 12/13/2013   INR 0.94 12/31/2012     Recent Radiographic Studies :  No results found.  DISCHARGE INSTRUCTIONS: Discharge Orders   Future Orders Complete By Expires   Call MD / Call 911  As directed    Comments:     If you experience chest pain or shortness of breath, CALL 911 and be transported to the hospital emergency room.  If you develope a fever above 101 F, pus (white drainage) or increased drainage or redness at the wound, or calf pain, call your surgeon's office.   Change dressing  As directed    Comments:     Change dressing on wednesday, then change the dressing daily with sterile 4 x 4 inch gauze dressing and apply TED hose.   Constipation Prevention  As directed    Comments:     Drink plenty of fluids.  Prune juice may be helpful.  You may use a stool softener, such as Colace (over the counter) 100 mg twice a day.  Use MiraLax (over the counter) for constipation as needed.   CPM   As directed    Comments:     Continuous passive motion machine (CPM):      Use the CPM from 0 to 90 for 6-8 hours per day.      You may increase by 10 per day.  You may break it up into 2 or 3 sessions per day.      Use CPM for 2 weeks or until you are told to stop.   Diet - low sodium heart healthy  As directed    Do not put a pillow under the knee. Place it under the heel.  As directed    Driving restrictions  As directed    Comments:     No driving for 6 weeks   Increase activity slowly as tolerated  As directed    Lifting restrictions  As directed    Comments:     No lifting for 6 weeks   TED hose  As directed    Comments:     Use stockings (TED hose) for 3 weeks on both leg(s).  You may remove them at night for sleeping.      DISCHARGE MEDICATIONS:     Medication List         ALPRAZolam 0.5 MG tablet  Commonly known as:  XANAX  Take 0.5 mg by mouth at bedtime.     calcium carbonate 1250 MG tablet  Commonly known as:  OS-CAL - dosed in mg of elemental calcium  Take 1 tablet by mouth daily with breakfast.     celecoxib 200 MG capsule  Commonly known as:  CELEBREX  Take 1 capsule (200 mg total) by mouth every 12 (twelve) hours.     enoxaparin 40 MG/0.4ML injection  Commonly known as:  LOVENOX  Inject 0.4 mLs (40 mg total) into the skin daily.     fluticasone 50 MCG/ACT nasal spray  Commonly known as:  FLONASE  Place 2 sprays into the nose daily.     HYDROcodone-acetaminophen 5-325 MG per tablet  Commonly known as:  NORCO  Take 1-2 tablets by mouth every 4 (four) hours as needed for moderate pain.     levothyroxine 112 MCG tablet  Commonly known as:  SYNTHROID, LEVOTHROID  Take 112 mcg by mouth daily.     lisinopril 10 MG tablet  Commonly known as:  PRINIVIL,ZESTRIL  Take 10 mg by mouth daily.     loratadine 10 MG tablet  Commonly known as:  CLARITIN  Take 10 mg by mouth daily.     methocarbamol 500 MG tablet  Commonly known as:  ROBAXIN  Take 1-2  tablets (500-1,000 mg total) by mouth every 6 (six) hours as needed for muscle spasms.     metroNIDAZOLE 0.75 % cream  Commonly known as:  METROCREAM  Apply 1 application topically 2 (two) times daily.     Vitamin D 2000 UNITS Caps  Take 1 capsule by mouth daily.        FOLLOW UP VISIT:       Follow-up Information   Follow up with Rudean Haskell, MD. Call on 01/04/2014.   Specialty:  Orthopedic Surgery   Contact information:   [address] [place] [address] [phone]       DISPOSITION: HOME VS. SNF  CONDITION:  Good   Kyanna Mahrt 12/22/2013, 1:53 PM

## 2013-12-23 ENCOUNTER — Encounter (HOSPITAL_COMMUNITY): Payer: Self-pay | Admitting: Orthopedic Surgery

## 2014-01-14 IMAGING — CR DG CHEST 2V
2 series · 2 of 2 positions shown · non-contrast
Comparison: None.

CLINICAL DATA: Preoperative evaluation for knee surgery

CHEST - 2 VIEW

[view not recorded (1 of 2)]
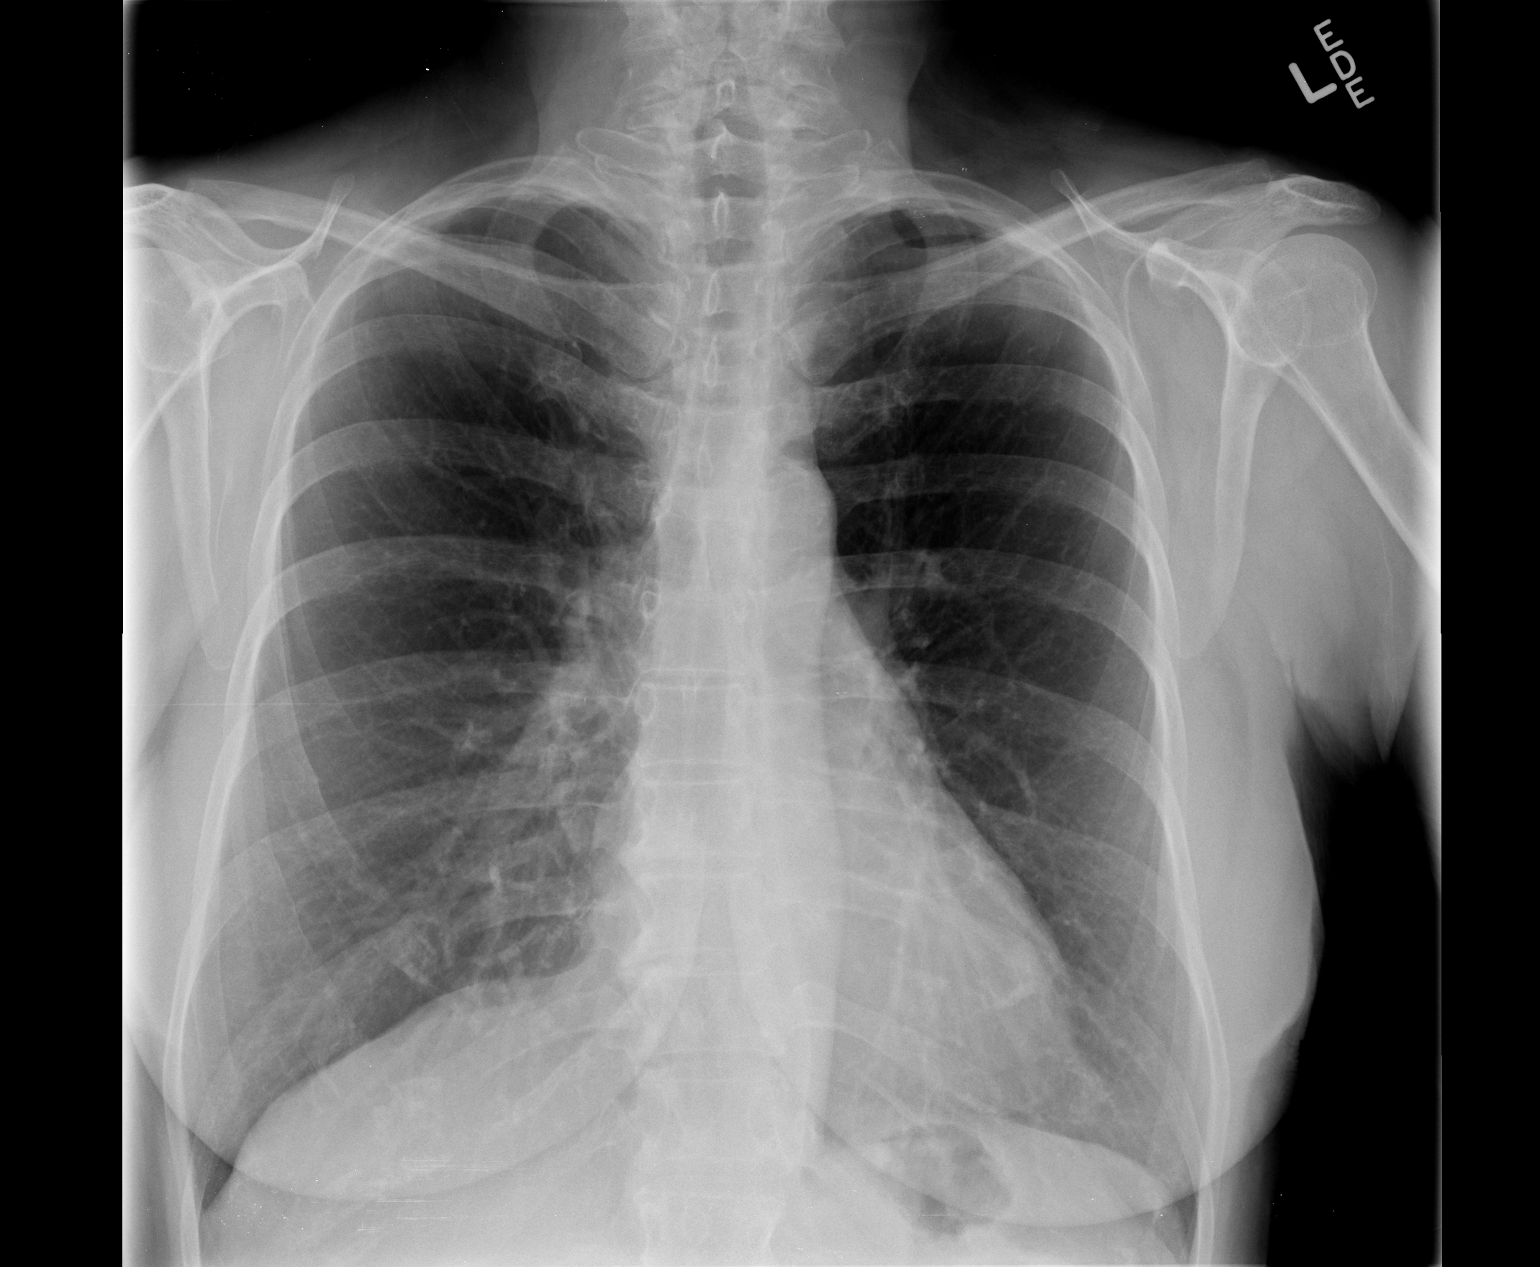

[view not recorded (2 of 2)]
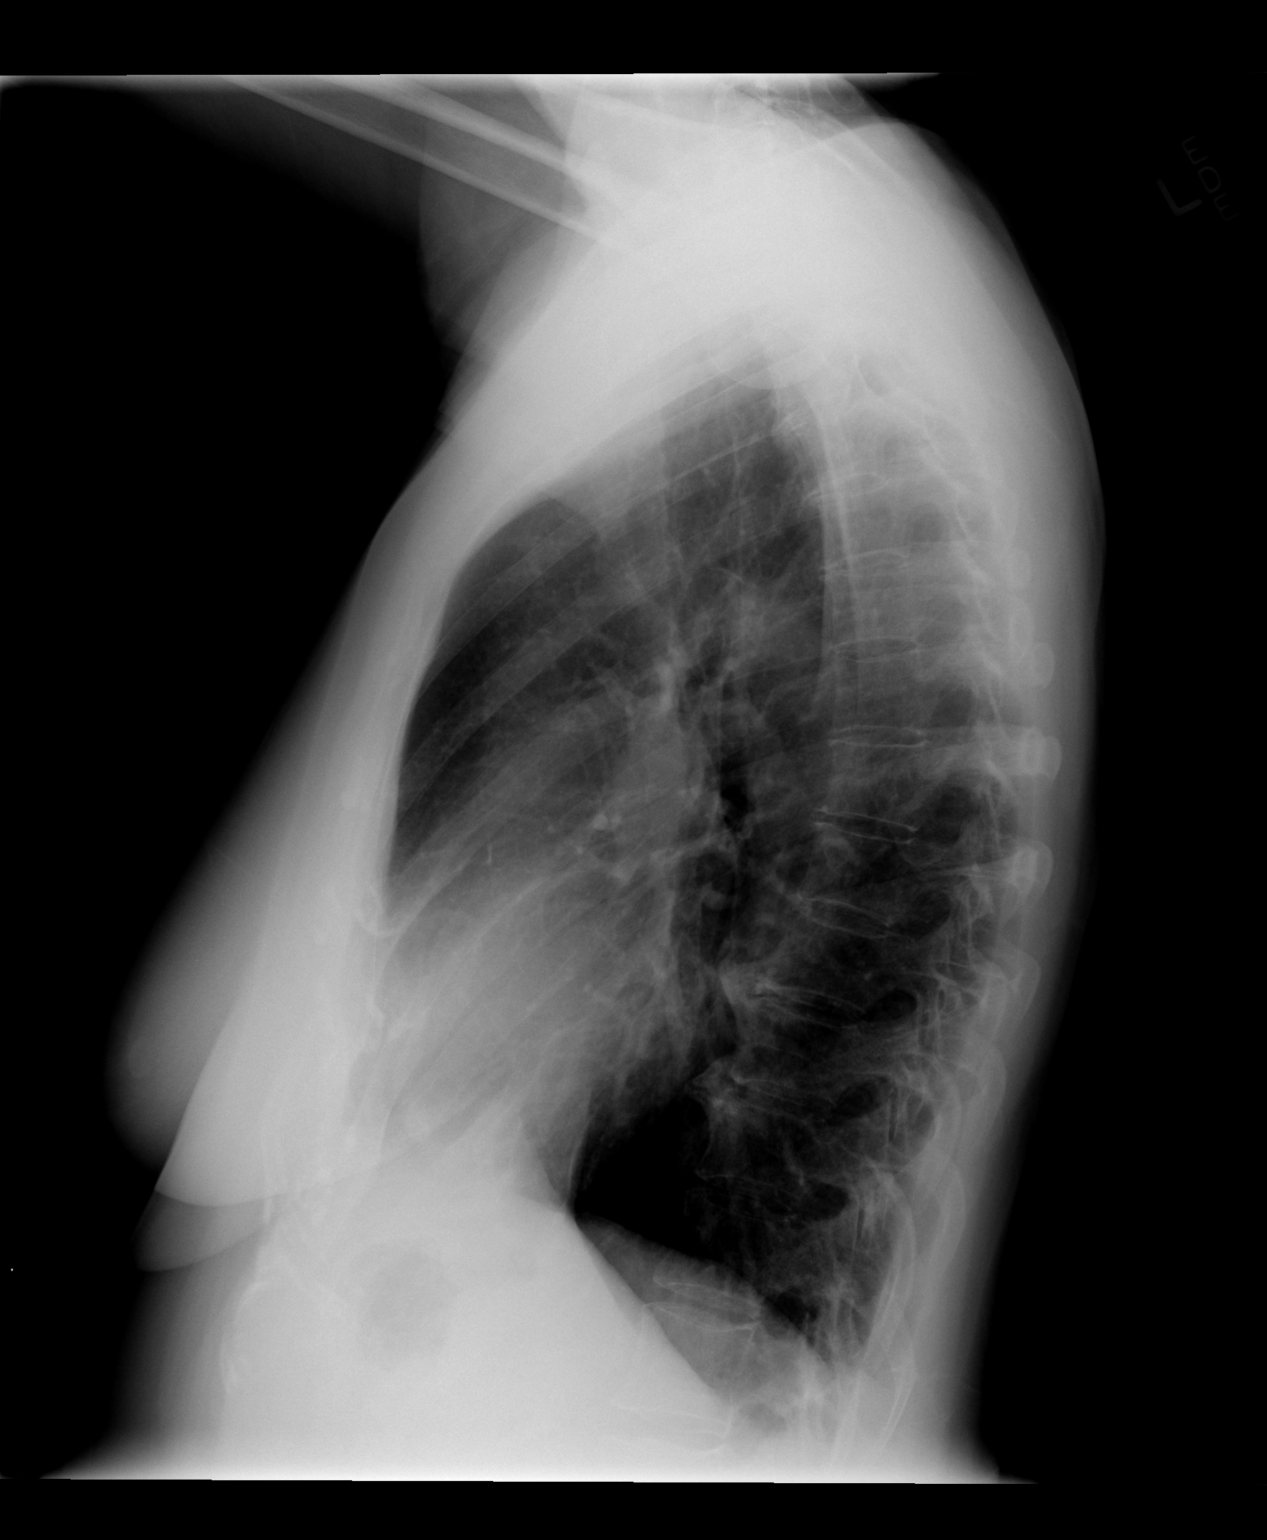

[2 of 2 positions shown; findings below may reference images not displayed]

FINDINGS: The heart pulmonary vascularity are within normal limits.
The lungs are well-aerated bilaterally without focal infiltrate.
No acute bony abnormality is seen.
IMPRESSION: No acute abnormality noted.

## 2014-01-26 NOTE — Op Note (Addendum)
NAMEPATIENCE, NUZZO NO.:  [id number]  MEDICAL RECORD NO.:  [id number]  LOCATION:  5N22C                        FACILITY:  [facility]  PHYSICIAN:  Estill Bamberg. Ronnie Derby, M.D. DATE OF BIRTH:  08-15-1941  DATE OF PROCEDURE:  12/20/2013 DATE OF DISCHARGE:  12/21/2013                              OPERATIVE REPORT   SURGEON:  Estill Bamberg. Ronnie Derby, M.D.  ASSISTED BY:  Nehemiah Massed, PA-C  ANESTHESIA:  General.  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  PROCEDURE:  Left knee revision with synovectomy with polyethylene exchange.  INDICATION FOR PROCEDURE:  This is a 73 year old, white female, was well over a year out from left knee replacement with continued synovial swelling and pain.  Informed consent was obtained.  DESCRIPTION OF PROCEDURE:  The patient was laid supine and administered general anesthesia.  The leg was prepped and draped in usual fashion. The extremity was exsanguinated with the Esmarch and tourniquet inflated to 350 mmHg.  A #10 blade was used to make a midline incision where the old incision was made.  New blade used to make a median parapatellar arthrotomy.  I performed synovectomy.  I performed a complete synovectomy  proceeding increased amount of range of motion at least 10 degrees on the table.  The polyethylene was removed the tendon synovectomy.  After completing this, we irrigated and then trialed the polyethylene insert put that back into place. We irrigated and closed with  buried interrupted #1 Vicryl sutures, and then 0 Vicryl sutures in the deep  soft tissues.  Running 2-0 Vicryl stitch and skin staples.  Dressed with Xeroform.   Dressed with sponges, sterile Webril, and an Ace wrap.  Complications none.  Drains none.          ______________________________ Estill Bamberg. Ronnie Derby, M.D.     SDL/MEDQ  D:  01/25/2014   T:  01/26/2014  Job:  [id number]

## 2014-04-26 IMAGING — NM NM BONE 3 PHASE
1 series · 12 of 12 positions shown · non-contrast
Comparison: None

CLINICAL DATA: Left knee pain.  Thyroid knee loosening.  This
history of partial right knee replacement and total left knee
arthroplasty.

NUCLEAR MEDICINE THREE PHASE BONE SCAN
TECHNIQUE: Radionuclide angiographic images, immediate static
blood pool images, and 3-hour delayed static images were obtained
after intravenous injection of radiopharmaceutical.
Radiopharmaceutical: [id number] QUEENA JUMPER TECHNETIUM TC 99M
MEDRONATE IV KIT

[Series 0: 3 phase bone · 9.33mm/px · 2 acquisitions, 12 frames shown]
[im 1/2  full-range]
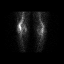
[im 1/2  full-range]
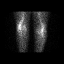
[im 1/2  full-range]
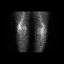
[im 1/2  full-range]
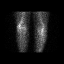
[im 1/2  full-range]
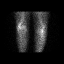
[im 1/2  full-range]
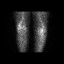
[im 2/2  full-range]
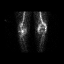
[im 2/2  full-range]
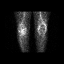
[im 2/2  full-range]
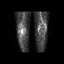
[im 2/2  full-range]
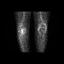
[im 2/2  full-range]
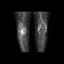
[im 2/2  full-range]
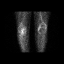

[12 of 12 positions shown; findings below may reference images not displayed]

FINDINGS: None

Vascular phase:   Increased arterial vascular flow to the left knee
primarily above the joint in the right knee in the medial
compartment.

Left Knee:  Minimal blood pool activity although there is activity
superior to the left knee joint and in the medial right knee.

Delayed phase:  Minimal on delayed phase uptake within the left
knee.  Mild uptake within the lateral aspect of the tibial plateau.
Moderate uptake within the patella and medial tibial plateau of the
right knee prosthetic.
IMPRESSION: 1.  No convincing evidence of loosening or infection.
2.  Mild hyperemia to the left and right knee.  This would favor
inflammation over bone  infection or loosening given the relatively
low uptake in the ladder two phases.

## 2017-01-29 ENCOUNTER — Other Ambulatory Visit (HOSPITAL_COMMUNITY): Payer: Self-pay | Admitting: Orthopedic Surgery

## 2017-01-29 DIAGNOSIS — Z96653 Presence of artificial knee joint, bilateral: Secondary | ICD-10-CM

## 2017-02-24 ENCOUNTER — Encounter (HOSPITAL_COMMUNITY)
Admission: RE | Admit: 2017-02-24 | Discharge: 2017-02-24 | Disposition: A | Discharge status: Home / Self Care | Payer: Medicare Other | Source: Ambulatory Visit | Attending: Orthopedic Surgery | Admitting: Orthopedic Surgery

## 2017-02-24 DIAGNOSIS — Z96653 Presence of artificial knee joint, bilateral: Secondary | ICD-10-CM | POA: Diagnosis present

## 2017-02-24 MED ORDER — TECHNETIUM TC 99M MEDRONATE IV KIT
21.9000 | PACK | Freq: Once | INTRAVENOUS | Status: AC | PRN
  Administered 2017-02-24: 21.9 via INTRAVENOUS

## 2020-01-26 ENCOUNTER — Encounter (HOSPITAL_COMMUNITY): Payer: Self-pay

## 2020-01-26 NOTE — Patient Instructions (Addendum)
DUE TO COVID-19 ONLY TWO VISITORS IS ALLOWED TO COME WITH YOU AND STAY IN THE WAITING ROOM ONLY DURING PRE OP AND PROCEDURE. THE ONE VISITOR MAY VISIT WITH YOU IN YOUR PRIVATE ROOM DURING VISITING HOURS ONLY!!   COVID SWAB TESTING MUST BE COMPLETED ON: Thursday, February 03, 2020 at 11:00 AM [address], [place] -[facility] enter pre surgical testing line (Must self quarantine after testing. Follow instructions on handout.)             Your procedure is scheduled on: Monday, February 07, 2020   Report to [facility] Main  Entrance    Report to admitting at 8:50 AM   Call this number if you have problems the morning of surgery [phone]   Do not eat food:After Midnight.   May have liquids until 8:20 AM day of surgery   CLEAR LIQUID DIET  Foods Allowed                                                                     Foods Excluded  Water, Black Coffee and tea, regular and decaf                             liquids that you cannot  Plain Jell-O in any flavor  (No red)                                           see through such as: Fruit ices (not with fruit pulp)                                     milk, soups, orange juice  Iced Popsicles (No red)                                    All solid food Carbonated beverages, regular and diet                                    Apple juices Sports drinks like Gatorade (No red) Lightly seasoned clear broth or consume(fat free) Sugar, honey syrup  Sample Menu Breakfast                                Lunch                                     Supper Cranberry juice                    Beef broth                            Chicken broth Jell-O  Grape juice                           Apple juice Coffee or tea                        Jell-O                                      Popsicle                                                Coffee or tea                        Coffee or  tea     Complete one Ensure drink the morning of surgery at 8:20 AM the day of surgery.   Oral Hygiene is also important to reduce your risk of infection.                                    Remember - BRUSH YOUR TEETH THE MORNING OF SURGERY WITH YOUR REGULAR TOOTHPASTE   Do NOT smoke after Midnight   Take these medicines the morning of surgery with A SIP OF WATER: Levothyroxine, Metoprolol                               You may not have any metal on your body including hair pins, jewelry, and body piercings             Do not wear make-up, lotions, powders, perfumes/cologne, or deodorant             Do not wear nail polish.  Do not shave  48 hours prior to surgery.               Do not bring valuables to the hospital. [place].   Contacts, dentures or bridgework may not be worn into surgery.   Bring small overnight bag day of surgery.    Patients discharged the day of surgery will not be allowed to drive home.   Special Instructions: Bring a copy of your healthcare power of attorney and living will documents         the day of surgery if you haven't scanned them in before.              Please read over the following fact sheets you were given:  [facility] - Preparing for Surgery Before surgery, you can play an important role.  Because skin is not sterile, your skin needs to be as free of germs as possible.  You can reduce the number of germs on your skin by washing with CHG (chlorahexidine gluconate) soap before surgery.  CHG is an antiseptic cleaner which kills germs and bonds with the skin to continue killing germs even after washing. Please DO NOT use if you have an allergy to CHG or antibacterial soaps.  If your skin becomes reddened/irritated  stop using the CHG and inform your nurse when you arrive at Short Stay. Do not shave (including legs and underarms) for at least 48 hours prior to the first CHG shower.  You may shave your  face/neck.  Please follow these instructions carefully:  1.  Shower with CHG Soap the night before surgery and the  morning of surgery.  2.  If you choose to wash your hair, wash your hair first as usual with your normal  shampoo.  3.  After you shampoo, rinse your hair and body thoroughly to remove the shampoo.                             4.  Use CHG as you would any other liquid soap.  You can apply chg directly to the skin and wash.  Gently with a scrungie or clean washcloth.  5.  Apply the CHG Soap to your body ONLY FROM THE NECK DOWN.   Do   not use on face/ open                           Wound or open sores. Avoid contact with eyes, ears mouth and   genitals (private parts).                       Wash face,  Genitals (private parts) with your normal soap.             6.  Wash thoroughly, paying special attention to the area where your    surgery  will be performed.  7.  Thoroughly rinse your body with warm water from the neck down.  8.  DO NOT shower/wash with your normal soap after using and rinsing off the CHG Soap.                9.  Pat yourself dry with a clean towel.            10.  Wear clean pajamas.            11.  Place clean sheets on your bed the night of your first shower and do not  sleep with pets. Day of Surgery : Do not apply any lotions/deodorants the morning of surgery.  Please wear clean clothes to the hospital/surgery center.  FAILURE TO FOLLOW THESE INSTRUCTIONS MAY RESULT IN THE CANCELLATION OF YOUR SURGERY  PATIENT SIGNATURE_________________________________  NURSE SIGNATURE__________________________________  ________________________________________________________________________   Adam Phenix  An incentive spirometer is a tool that can help keep your lungs clear and active. This tool measures how well you are filling your lungs with each breath. Taking long deep breaths may help reverse or decrease the chance of developing breathing (pulmonary)  problems (especially infection) following:  A long period of time when you are unable to move or be active. BEFORE THE PROCEDURE   If the spirometer includes an indicator to show your best effort, your nurse or respiratory therapist will set it to a desired goal.  If possible, sit up straight or lean slightly forward. Try not to slouch.  Hold the incentive spirometer in an upright position. INSTRUCTIONS FOR USE  1. Sit on the edge of your bed if possible, or sit up as far as you can in bed or on a chair. 2. Hold the incentive spirometer in an upright position. 3. Breathe out normally. 4. Place the mouthpiece  in your mouth and seal your lips tightly around it. 5. Breathe in slowly and as deeply as possible, raising the piston or the ball toward the top of the column. 6. Hold your breath for 3-5 seconds or for as long as possible. Allow the piston or ball to fall to the bottom of the column. 7. Remove the mouthpiece from your mouth and breathe out normally. 8. Rest for a few seconds and repeat Steps 1 through 7 at least 10 times every 1-2 hours when you are awake. Take your time and take a few normal breaths between deep breaths. 9. The spirometer may include an indicator to show your best effort. Use the indicator as a goal to work toward during each repetition. 10. After each set of 10 deep breaths, practice coughing to be sure your lungs are clear. If you have an incision (the cut made at the time of surgery), support your incision when coughing by placing a pillow or rolled up towels firmly against it. Once you are able to get out of bed, walk around indoors and cough well. You may stop using the incentive spirometer when instructed by your caregiver.  RISKS AND COMPLICATIONS  Take your time so you do not get dizzy or light-headed.  If you are in pain, you may need to take or ask for pain medication before doing incentive spirometry. It is harder to take a deep breath if you are having  pain. AFTER USE  Rest and breathe slowly and easily.  It can be helpful to keep track of a log of your progress. Your caregiver can provide you with a simple table to help with this. If you are using the spirometer at home, follow these instructions: [place] IF:   You are having difficultly using the spirometer.  You have trouble using the spirometer as often as instructed.  Your pain medication is not giving enough relief while using the spirometer.  You develop fever of 100.5 F (38.1 C) or higher. SEEK IMMEDIATE MEDICAL CARE IF:   You cough up bloody sputum that had not been present before.  You develop fever of 102 F (38.9 C) or greater.  You develop worsening pain at or near the incision site. MAKE SURE YOU:   Understand these instructions.  Will watch your condition.  Will get help right away if you are not doing well or get worse. Document Released: 03/10/2007 Document Revised: 01/20/2012 Document Reviewed: 05/11/2007 ExitCare Patient Information 2014 [employer], [place].   ________________________________________________________________________  WHAT IS A BLOOD TRANSFUSION? Blood Transfusion Information  A transfusion is the replacement of blood or some of its parts. Blood is made up of multiple cells which provide different functions.  Red blood cells carry oxygen and are used for blood loss replacement.  White blood cells fight against infection.  Platelets control bleeding.  Plasma helps clot blood.  Other blood products are available for specialized needs, such as hemophilia or other clotting disorders. BEFORE THE TRANSFUSION  Who gives blood for transfusions?   Healthy volunteers who are fully evaluated to make sure their blood is safe. This is blood bank blood. Transfusion therapy is the safest it has ever been in the practice of medicine. Before blood is taken from a donor, a complete history is taken to make sure that person has no history  of diseases nor engages in risky social behavior (examples are intravenous drug use or sexual activity with multiple partners). The donor's travel history is screened to minimize  risk of transmitting infections, such as malaria. The donated blood is tested for signs of infectious diseases, such as HIV and hepatitis. The blood is then tested to be sure it is compatible with you in order to minimize the chance of a transfusion reaction. If you or a relative donates blood, this is often done in anticipation of surgery and is not appropriate for emergency situations. It takes many days to process the donated blood. RISKS AND COMPLICATIONS Although transfusion therapy is very safe and saves many lives, the main dangers of transfusion include:   Getting an infectious disease.  Developing a transfusion reaction. This is an allergic reaction to something in the blood you were given. Every precaution is taken to prevent this. The decision to have a blood transfusion has been considered carefully by your caregiver before blood is given. Blood is not given unless the benefits outweigh the risks. AFTER THE TRANSFUSION  Right after receiving a blood transfusion, you will usually feel much better and more energetic. This is especially true if your red blood cells have gotten low (anemic). The transfusion raises the level of the red blood cells which carry oxygen, and this usually causes an energy increase.  The nurse administering the transfusion will monitor you carefully for complications. HOME CARE INSTRUCTIONS  No special instructions are needed after a transfusion. You may find your energy is better. Speak with your caregiver about any limitations on activity for underlying diseases you may have. SEEK MEDICAL CARE IF:   Your condition is not improving after your transfusion.  You develop redness or irritation at the intravenous (IV) site. SEEK IMMEDIATE MEDICAL CARE IF:  Any of the following symptoms  occur over the next 12 hours:  Shaking chills.  You have a temperature by mouth above 102 F (38.9 C), not controlled by medicine.  Chest, back, or muscle pain.  People around you feel you are not acting correctly or are confused.  Shortness of breath or difficulty breathing.  Dizziness and fainting.  You get a rash or develop hives.  You have a decrease in urine output.  Your urine turns a dark color or changes to pink, red, or brown. Any of the following symptoms occur over the next 10 days:  You have a temperature by mouth above 102 F (38.9 C), not controlled by medicine.  Shortness of breath.  Weakness after normal activity.  The white part of the eye turns yellow (jaundice).  You have a decrease in the amount of urine or are urinating less often.  Your urine turns a dark color or changes to pink, red, or brown. Document Released: 10/25/2000 Document Revised: 01/20/2012 Document Reviewed: 06/13/2008 [facility] Patient Information 2014 [place], [place].  _______________________________________________________________________

## 2020-01-28 ENCOUNTER — Other Ambulatory Visit: Payer: Self-pay | Admitting: Orthopedic Surgery

## 2020-01-31 ENCOUNTER — Other Ambulatory Visit: Payer: Self-pay

## 2020-01-31 ENCOUNTER — Encounter (HOSPITAL_COMMUNITY)
Admission: RE | Admit: 2020-01-31 | Discharge: 2020-01-31 | Disposition: A | Discharge status: Home / Self Care | Payer: Medicare Other | Source: Ambulatory Visit | Attending: Orthopedic Surgery | Admitting: Orthopedic Surgery

## 2020-01-31 ENCOUNTER — Encounter (HOSPITAL_COMMUNITY): Payer: Self-pay

## 2020-01-31 HISTORY — DX: Personal history of other endocrine, nutritional and metabolic disease: Z86.39

## 2020-01-31 HISTORY — DX: Irritable bowel syndrome without diarrhea: K58.9

## 2020-01-31 HISTORY — DX: Pure hypercholesterolemia, unspecified: E78.00

## 2020-01-31 HISTORY — DX: Generalized anxiety disorder: F41.1

## 2020-01-31 HISTORY — DX: Unspecified cataract: H26.9

## 2020-01-31 HISTORY — DX: Personal history of other diseases of the digestive system: Z87.19

## 2020-01-31 HISTORY — DX: Basal cell carcinoma of skin, unspecified: C44.91

## 2020-01-31 HISTORY — DX: Gastro-esophageal reflux disease without esophagitis: K21.9

## 2020-01-31 HISTORY — DX: Tachycardia, unspecified: R00.0

## 2020-01-31 HISTORY — DX: Inflammatory liver disease, unspecified: K75.9

## 2020-01-31 HISTORY — DX: Pneumonia, unspecified organism: J18.9

## 2020-01-31 HISTORY — DX: Personal history of irradiation: Z92.3

## 2020-01-31 HISTORY — DX: Personal history of diseases of the blood and blood-forming organs and certain disorders involving the immune mechanism: Z86.2

## 2020-01-31 HISTORY — DX: Other cervical disc degeneration, unspecified cervical region: M50.30

## 2020-01-31 HISTORY — DX: Unspecified osteoarthritis, unspecified site: M19.90

## 2020-01-31 HISTORY — DX: Other specified postprocedural states: Z98.890

## 2020-01-31 HISTORY — DX: Restless legs syndrome: G25.81

## 2020-01-31 NOTE — Progress Notes (Signed)
Ms. Pilcher has completed her COVID 47 vaccine series  PCP - Dr. Benito Mccreedy pre op note 12/31/19 in chart Cardiologist - Dr. Lucia Gaskins clearance and office note 12/31/19 in chart  Chest x-ray - N/A EKG - 12/29/19 in chart Stress Test - greater than 2 years ECHO - 01/13/20 Dr. Lucia Gaskins Cardiac Cath - greater than 2 years  Sleep Study - N/A CPAP - N/A  Fasting Blood Sugar - N/A Checks Blood Sugar _N/A____ times a day  Blood Thinner Instructions: N/A Aspirin Instructions:N/A Last Dose:N/A  Anesthesia review: awaiting ECHO results 01/13/20 sent fax  Patient denies shortness of breath, fever, cough and chest pain at PAT appointment   Patient verbalized understanding of instructions that were given to them at the PAT appointment. Patient was also instructed that they will need to review over the PAT instructions again at home before surgery.

## 2020-02-01 ENCOUNTER — Encounter (HOSPITAL_COMMUNITY)
Admission: RE | Admit: 2020-02-01 | Discharge: 2020-02-01 | Disposition: A | Discharge status: Home / Self Care | Payer: Medicare Other | Source: Ambulatory Visit | Attending: Orthopedic Surgery | Admitting: Orthopedic Surgery

## 2020-02-01 ENCOUNTER — Encounter (HOSPITAL_COMMUNITY): Payer: Medicare Other

## 2020-02-01 ENCOUNTER — Other Ambulatory Visit (HOSPITAL_COMMUNITY): Payer: Medicare Other

## 2020-02-01 DIAGNOSIS — Z01812 Encounter for preprocedural laboratory examination: Secondary | ICD-10-CM | POA: Diagnosis not present

## 2020-02-01 LAB — CBC WITH DIFFERENTIAL/PLATELET
Abs Immature Granulocytes: 0.02 10*3/uL (ref 0.00–0.07)
Basophils Absolute: 0 10*3/uL (ref 0.0–0.1)
Basophils Relative: 1 %
Eosinophils Absolute: 0 10*3/uL (ref 0.0–0.5)
Eosinophils Relative: 1 %
HCT: 40.5 % (ref 36.0–46.0)
Hemoglobin: 12.7 g/dL (ref 12.0–15.0)
Immature Granulocytes: 1 %
Lymphocytes Relative: 26 %
Lymphs Abs: 1.1 10*3/uL (ref 0.7–4.0)
MCH: 29.3 pg (ref 26.0–34.0)
MCHC: 31.4 g/dL (ref 30.0–36.0)
MCV: 93.3 fL (ref 80.0–100.0)
Monocytes Absolute: 0.5 10*3/uL (ref 0.1–1.0)
Monocytes Relative: 12 %
Neutro Abs: 2.5 10*3/uL (ref 1.7–7.7)
Neutrophils Relative %: 59 %
Platelets: 186 10*3/uL (ref 150–400)
RBC: 4.34 MIL/uL (ref 3.87–5.11)
RDW: 12 % (ref 11.5–15.5)
WBC: 4.2 10*3/uL (ref 4.0–10.5)
nRBC: 0 % (ref 0.0–0.2)

## 2020-02-01 LAB — COMPREHENSIVE METABOLIC PANEL
ALT: 15 U/L (ref 0–44)
AST: 24 U/L (ref 15–41)
Albumin: 4.1 g/dL (ref 3.5–5.0)
Alkaline Phosphatase: 67 U/L (ref 38–126)
Anion gap: 7 (ref 5–15)
BUN: 19 mg/dL (ref 8–23)
CO2: 28 mmol/L (ref 22–32)
Calcium: 9.4 mg/dL (ref 8.9–10.3)
Chloride: 105 mmol/L (ref 98–111)
Creatinine, Ser: 0.62 mg/dL (ref 0.44–1.00)
GFR calc Af Amer: >60 mL/min (ref >60)
GFR calc non Af Amer: >60 mL/min (ref >60)
Glucose, Bld: 104 mg/dL — ABNORMAL HIGH (ref 70–99)
Potassium: 4.9 mmol/L (ref 3.5–5.1)
Sodium: 140 mmol/L (ref 135–145)
Total Bilirubin: 0.7 mg/dL (ref 0.3–1.2)
Total Protein: 6.9 g/dL (ref 6.5–8.1)

## 2020-02-01 LAB — ABO/RH: ABO/RH(D): O POS

## 2020-02-01 LAB — SURGICAL PCR SCREEN
MRSA, PCR: NEGATIVE
Staphylococcus aureus: POSITIVE — AB

## 2020-02-01 NOTE — Progress Notes (Signed)
PCR results 02/01/20 faxed to Dr. Ronnie Derby via epic.

## 2020-02-03 ENCOUNTER — Other Ambulatory Visit (HOSPITAL_COMMUNITY): Payer: Medicare Other

## 2020-02-03 ENCOUNTER — Other Ambulatory Visit (HOSPITAL_COMMUNITY)
Admission: RE | Admit: 2020-02-03 | Discharge: 2020-02-03 | Disposition: A | Discharge status: Home / Self Care | Payer: Medicare Other | Source: Ambulatory Visit | Attending: Orthopedic Surgery | Admitting: Orthopedic Surgery

## 2020-02-03 DIAGNOSIS — Z01812 Encounter for preprocedural laboratory examination: Secondary | ICD-10-CM | POA: Insufficient documentation

## 2020-02-03 DIAGNOSIS — Z20822 Contact with and (suspected) exposure to covid-19: Secondary | ICD-10-CM | POA: Insufficient documentation

## 2020-02-03 LAB — SARS CORONAVIRUS 2 (TAT 6-24 HRS): SARS Coronavirus 2: NEGATIVE

## 2020-02-03 NOTE — Progress Notes (Signed)
Anesthesia Chart Review   Case: [id number] Date/Time: 02/07/20 1105   Procedure: TOTAL KNEE REVISION (Left Knee)   Anesthesia type: Spinal   Pre-op diagnosis: Failed Total knee arthroplasty   Location: [facility] [facility] / [facility] ORS   Surgeons: Vickey Huger, MD       DISCUSSION:79 y.o. former smoker (30 pack years, quit 11/11/97) with h/o PONV, HTN, hypothyroidism, GERD, mild pulmonary HTN on Echo 01/13/20, failed total knee arthroplasty scheduled for above procedure 02/07/20 with Dr. Vickey Huger.   Pt seen by cardiologist, Dr. Luiz Ochoa, 12/31/19 for preoperative evaluation.  Per OV note, "Patient is scheduled for left knee replacement under general anesthesia.  She has normal coronaries and cardiac catheterization two years ago and no major valvular abnormalities.  She has no angina, arrhythmia, congestive heart failure symptoms.  She is on low-dose beta-blocker therapy.  Patient would be considered a low cardiovascular risk for surgery.  Beta blockers should be continued perioperatively."  Anticipate pt can proceed with planned procedure barring acute status change.   VS: Ht 5' 4.5" (1.638 m)   Wt 54.4 kg   BMI 20.28 kg/m   PROVIDERS: Pallone, Jarvis Newcomer, MD is PCP   Laymond Purser, MD is Cardiologist  LABS: Labs reviewed: Acceptable for surgery. (all labs ordered are listed, but only abnormal results are displayed)  Labs Reviewed - No data to display   IMAGES:   EKG: On chart   CV: Echo 01/13/20 Conclusions:  Normal biventricular dimensions and systolic function.  Degenerative changes of the mitral valve as noted with trivial to mild regurgitation Mild pulmonary hypertension Incidental finding of an approximately 4 x 6 cm simple hepatic cyst.  Past Medical History:  Diagnosis Date   Basal cell carcinoma    face   Cataract    Small   DDD (degenerative disc disease), cervical    GAD (generalized anxiety disorder)    GERD (gastroesophageal reflux disease)    Hepatitis    Drug  induced   History of carpal tunnel release    Left    History of hiatal hernia    History of iron deficiency anemia    in childhood   History of multinodular goiter    Hypercholesterolemia    Hypertension    Hypothyroidism    IBS (irritable bowel syndrome)    OA (osteoarthritis)    Pneumonia    childhood   PONV (postoperative nausea and vomiting)    Restless leg syndrome    S/P radioactive iodine thyroid ablation    Tachycardia     Past Surgical History:  Procedure Laterality Date   ABDOMINAL HYSTERECTOMY  1988   APPENDECTOMY     BLADDER SUSPENSION  2011   BREAST LUMPECTOMY Left    CARPAL TUNNEL RELEASE Left    COLONOSCOPY     COLONOSCOPY     I & D KNEE WITH POLY EXCHANGE Left 12/20/2013   Procedure: IRRIGATION AND DEBRIDEMENT KNEE WITH POLY EXCHANGE;  Surgeon: Vickey Huger, MD;  Location: [place];  Service: Orthopedics;  Laterality: Left;   INGUINAL HERNIA REPAIR Bilateral    JOINT REPLACEMENT Left 2012   PARTIAL KNEE ARTHROPLASTY Right 01/04/2013   Procedure: PARTIAL KNEE REPLACEMENT/UNICOMPARTMENTAL MEDIAL;  Surgeon: Vickey Huger, MD;  Location: [place];  Service: Orthopedics;  Laterality: Right;   TONSILLECTOMY AND ADENOIDECTOMY  1947   TOTAL HIP ARTHROPLASTY Right    TOTAL KNEE ARTHROPLASTY Left    UPPER GI ENDOSCOPY      MEDICATIONS:  acetaminophen (TYLENOL) 650 MG CR  tablet   ALPRAZolam (XANAX) 1 MG tablet   Ascorbic Acid (VITAMIN C WITH ROSE HIPS) 500 MG tablet   Biotin 5000 MCG TABS   calcium citrate-vitamin D 500-400 MG-UNIT chewable tablet   cholecalciferol (VITAMIN D3) 25 MCG (1000 UNIT) tablet   fluticasone (FLONASE) 50 MCG/ACT nasal spray   hydrocortisone 2.5 % lotion   lansoprazole (PREVACID) 15 MG capsule   levothyroxine (SYNTHROID) 88 MCG tablet   lisinopril (PRINIVIL,ZESTRIL) 10 MG tablet   loratadine (CLARITIN) 10 MG tablet   metoprolol tartrate (LOPRESSOR) 25 MG tablet   metroNIDAZOLE (METROCREAM) 0.75 % cream   pramipexole (MIRAPEX) 0.25 MG tablet    rosuvastatin (CRESTOR) 5 MG tablet   triamcinolone cream (KENALOG) 0.1 %   No current facility-administered medications for this encounter.     Maia Plan [facility] Pre-Surgical Testing [phone] 02/03/20  1:37 PM

## 2020-02-06 MED ORDER — BUPIVACAINE LIPOSOME 1.3 % IJ SUSP
20.0000 mL | Freq: Once | INTRAMUSCULAR | Status: DC
  Filled 2020-02-06: qty 20

## 2020-02-07 ENCOUNTER — Ambulatory Visit (HOSPITAL_COMMUNITY): Payer: Medicare Other | Admitting: Physician Assistant

## 2020-02-07 ENCOUNTER — Observation Stay (HOSPITAL_COMMUNITY)
Admission: RE | Admit: 2020-02-07 | Discharge: 2020-02-08 | Disposition: A | Discharge status: Home / Self Care | Payer: Medicare Other | Attending: Orthopedic Surgery | Admitting: Orthopedic Surgery

## 2020-02-07 ENCOUNTER — Encounter (HOSPITAL_COMMUNITY)
Admission: RE | Disposition: A | Discharge status: Home / Self Care | Payer: Self-pay | Source: Home / Self Care | Attending: Orthopedic Surgery

## 2020-02-07 ENCOUNTER — Ambulatory Visit (HOSPITAL_COMMUNITY): Payer: Medicare Other | Admitting: Anesthesiology

## 2020-02-07 ENCOUNTER — Encounter (HOSPITAL_COMMUNITY): Payer: Self-pay | Admitting: Orthopedic Surgery

## 2020-02-07 ENCOUNTER — Other Ambulatory Visit: Payer: Self-pay

## 2020-02-07 DIAGNOSIS — Z888 Allergy status to other drugs, medicaments and biological substances status: Secondary | ICD-10-CM | POA: Insufficient documentation

## 2020-02-07 DIAGNOSIS — E039 Hypothyroidism, unspecified: Secondary | ICD-10-CM | POA: Diagnosis not present

## 2020-02-07 DIAGNOSIS — E7800 Pure hypercholesterolemia, unspecified: Secondary | ICD-10-CM | POA: Insufficient documentation

## 2020-02-07 DIAGNOSIS — Z96659 Presence of unspecified artificial knee joint: Secondary | ICD-10-CM

## 2020-02-07 DIAGNOSIS — Y838 Other surgical procedures as the cause of abnormal reaction of the patient, or of later complication, without mention of misadventure at the time of the procedure: Secondary | ICD-10-CM | POA: Insufficient documentation

## 2020-02-07 DIAGNOSIS — E785 Hyperlipidemia, unspecified: Secondary | ICD-10-CM | POA: Insufficient documentation

## 2020-02-07 DIAGNOSIS — T84033A Mechanical loosening of internal left knee prosthetic joint, initial encounter: Secondary | ICD-10-CM | POA: Diagnosis not present

## 2020-02-07 DIAGNOSIS — K219 Gastro-esophageal reflux disease without esophagitis: Secondary | ICD-10-CM | POA: Insufficient documentation

## 2020-02-07 DIAGNOSIS — Z79899 Other long term (current) drug therapy: Secondary | ICD-10-CM | POA: Insufficient documentation

## 2020-02-07 DIAGNOSIS — Z881 Allergy status to other antibiotic agents status: Secondary | ICD-10-CM | POA: Insufficient documentation

## 2020-02-07 DIAGNOSIS — G2581 Restless legs syndrome: Secondary | ICD-10-CM | POA: Insufficient documentation

## 2020-02-07 DIAGNOSIS — Z885 Allergy status to narcotic agent status: Secondary | ICD-10-CM | POA: Diagnosis not present

## 2020-02-07 DIAGNOSIS — M1990 Unspecified osteoarthritis, unspecified site: Secondary | ICD-10-CM | POA: Diagnosis not present

## 2020-02-07 DIAGNOSIS — Z96641 Presence of right artificial hip joint: Secondary | ICD-10-CM | POA: Insufficient documentation

## 2020-02-07 DIAGNOSIS — K589 Irritable bowel syndrome without diarrhea: Secondary | ICD-10-CM | POA: Diagnosis not present

## 2020-02-07 DIAGNOSIS — Z79890 Hormone replacement therapy (postmenopausal): Secondary | ICD-10-CM | POA: Diagnosis not present

## 2020-02-07 DIAGNOSIS — I10 Essential (primary) hypertension: Secondary | ICD-10-CM | POA: Insufficient documentation

## 2020-02-07 DIAGNOSIS — Z87891 Personal history of nicotine dependence: Secondary | ICD-10-CM | POA: Insufficient documentation

## 2020-02-07 HISTORY — PX: TOTAL KNEE REVISION: SHX996

## 2020-02-07 LAB — TYPE AND SCREEN
ABO/RH(D): O POS
Antibody Screen: NEGATIVE

## 2020-02-07 SURGERY — TOTAL KNEE REVISION
Anesthesia: General | Site: Knee | Laterality: Left

## 2020-02-07 MED ORDER — FENTANYL CITRATE (PF) 100 MCG/2ML IJ SOLN: 25.0000 ug | INTRAMUSCULAR | Status: DC | PRN

## 2020-02-07 MED ORDER — ONDANSETRON HCL 4 MG/2ML IJ SOLN: 4.0000 mg | Freq: Once | INTRAMUSCULAR | Status: DC | PRN

## 2020-02-07 MED ORDER — BUPIVACAINE HCL (PF) 0.25 % IJ SOLN
INTRAMUSCULAR | Status: AC
  Filled 2020-02-07: qty 30

## 2020-02-07 MED ORDER — SODIUM CHLORIDE 0.9 % IV SOLN: INTRAVENOUS | Status: DC

## 2020-02-07 MED ORDER — EPHEDRINE SULFATE-NACL 50-0.9 MG/10ML-% IV SOSY
PREFILLED_SYRINGE | INTRAVENOUS | Status: DC | PRN
  Administered 2020-02-07 (×3): 10 mg via INTRAVENOUS

## 2020-02-07 MED ORDER — PANTOPRAZOLE SODIUM 40 MG PO TBEC
40.0000 mg | DELAYED_RELEASE_TABLET | Freq: Every day | ORAL | Status: DC
  Administered 2020-02-07 – 2020-02-08 (×2): 40 mg via ORAL
  Filled 2020-02-07 (×2): qty 1

## 2020-02-07 MED ORDER — CHLORHEXIDINE GLUCONATE 4 % EX LIQD: 60.0000 mL | Freq: Once | CUTANEOUS | Status: DC

## 2020-02-07 MED ORDER — FENTANYL CITRATE (PF) 100 MCG/2ML IJ SOLN
INTRAMUSCULAR | Status: AC
  Filled 2020-02-07: qty 2

## 2020-02-07 MED ORDER — MENTHOL 3 MG MT LOZG: 1.0000 | LOZENGE | OROMUCOSAL | Status: DC | PRN

## 2020-02-07 MED ORDER — ACETAMINOPHEN 500 MG PO TABS
1000.0000 mg | ORAL_TABLET | Freq: Once | ORAL | Status: AC
  Administered 2020-02-07: 1000 mg via ORAL
  Filled 2020-02-07: qty 2

## 2020-02-07 MED ORDER — PRAMIPEXOLE DIHYDROCHLORIDE 0.25 MG PO TABS
0.5000 mg | ORAL_TABLET | Freq: Every day | ORAL | Status: DC
  Administered 2020-02-07: 0.5 mg via ORAL
  Filled 2020-02-07: qty 2

## 2020-02-07 MED ORDER — ONDANSETRON HCL 4 MG/2ML IJ SOLN
INTRAMUSCULAR | Status: DC | PRN
  Administered 2020-02-07: 4 mg via INTRAVENOUS

## 2020-02-07 MED ORDER — LACTATED RINGERS IV SOLN: INTRAVENOUS | Status: DC

## 2020-02-07 MED ORDER — SODIUM CHLORIDE 0.9 % IR SOLN
Status: DC | PRN
  Administered 2020-02-07 (×2): 1000 mL

## 2020-02-07 MED ORDER — CEFAZOLIN SODIUM-DEXTROSE 2-4 GM/100ML-% IV SOLN
2.0000 g | INTRAVENOUS | Status: AC
  Administered 2020-02-07: 2 g via INTRAVENOUS
  Filled 2020-02-07: qty 100

## 2020-02-07 MED ORDER — SODIUM CHLORIDE (PF) 0.9 % IJ SOLN
INTRAMUSCULAR | Status: AC
  Filled 2020-02-07: qty 20

## 2020-02-07 MED ORDER — HYDROMORPHONE HCL 1 MG/ML IJ SOLN
INTRAMUSCULAR | Status: DC | PRN
  Administered 2020-02-07 (×3): .5 mg via INTRAVENOUS

## 2020-02-07 MED ORDER — LISINOPRIL 10 MG PO TABS
10.0000 mg | ORAL_TABLET | Freq: Every day | ORAL | Status: DC
  Administered 2020-02-07: 10 mg via ORAL
  Filled 2020-02-07 (×2): qty 1

## 2020-02-07 MED ORDER — DIPHENHYDRAMINE HCL 12.5 MG/5ML PO ELIX: 12.5000 mg | ORAL_SOLUTION | ORAL | Status: DC | PRN

## 2020-02-07 MED ORDER — DOCUSATE SODIUM 100 MG PO CAPS
100.0000 mg | ORAL_CAPSULE | Freq: Two times a day (BID) | ORAL | Status: DC
  Administered 2020-02-07 – 2020-02-08 (×2): 100 mg via ORAL
  Filled 2020-02-07 (×4): qty 1

## 2020-02-07 MED ORDER — MIDAZOLAM HCL 2 MG/2ML IJ SOLN
1.0000 mg | INTRAMUSCULAR | Status: DC
  Administered 2020-02-07: 1 mg via INTRAVENOUS
  Filled 2020-02-07: qty 2

## 2020-02-07 MED ORDER — PANTOPRAZOLE SODIUM 20 MG PO TBEC: 20.0000 mg | DELAYED_RELEASE_TABLET | Freq: Every day | ORAL | Status: DC

## 2020-02-07 MED ORDER — METOPROLOL TARTRATE 25 MG PO TABS
25.0000 mg | ORAL_TABLET | Freq: Every day | ORAL | Status: DC
  Filled 2020-02-07: qty 1

## 2020-02-07 MED ORDER — MEPERIDINE HCL 50 MG/ML IJ SOLN: 6.2500 mg | INTRAMUSCULAR | Status: DC | PRN

## 2020-02-07 MED ORDER — ONDANSETRON HCL 4 MG PO TABS: 4.0000 mg | ORAL_TABLET | Freq: Four times a day (QID) | ORAL | Status: DC | PRN

## 2020-02-07 MED ORDER — BUPIVACAINE HCL (PF) 0.25 % IJ SOLN
INTRAMUSCULAR | Status: DC | PRN
  Administered 2020-02-07: 30 mL

## 2020-02-07 MED ORDER — PROPOFOL 10 MG/ML IV BOLUS
INTRAVENOUS | Status: AC
  Filled 2020-02-07: qty 20

## 2020-02-07 MED ORDER — HYDROMORPHONE HCL 2 MG/ML IJ SOLN
INTRAMUSCULAR | Status: AC
  Filled 2020-02-07: qty 1

## 2020-02-07 MED ORDER — ROCURONIUM BROMIDE 50 MG/5ML IV SOSY
PREFILLED_SYRINGE | INTRAVENOUS | Status: DC | PRN
  Administered 2020-02-07: 50 mg via INTRAVENOUS

## 2020-02-07 MED ORDER — SODIUM CHLORIDE (PF) 0.9 % IJ SOLN
INTRAMUSCULAR | Status: DC | PRN
  Administered 2020-02-07: 20 mL

## 2020-02-07 MED ORDER — PHENOL 1.4 % MT LIQD: 1.0000 | OROMUCOSAL | Status: DC | PRN

## 2020-02-07 MED ORDER — GABAPENTIN 300 MG PO CAPS
300.0000 mg | ORAL_CAPSULE | Freq: Three times a day (TID) | ORAL | Status: DC
  Administered 2020-02-07 – 2020-02-08 (×3): 300 mg via ORAL
  Filled 2020-02-07 (×3): qty 1

## 2020-02-07 MED ORDER — BUPIVACAINE LIPOSOME 1.3 % IJ SUSP
INTRAMUSCULAR | Status: DC | PRN
  Administered 2020-02-07: 20 mL

## 2020-02-07 MED ORDER — ONDANSETRON HCL 4 MG/2ML IJ SOLN
INTRAMUSCULAR | Status: AC
  Filled 2020-02-07: qty 2

## 2020-02-07 MED ORDER — LIDOCAINE 2% (20 MG/ML) 5 ML SYRINGE
INTRAMUSCULAR | Status: AC
  Filled 2020-02-07: qty 5

## 2020-02-07 MED ORDER — OXYCODONE HCL 5 MG PO TABS: 5.0000 mg | ORAL_TABLET | ORAL | Status: DC | PRN

## 2020-02-07 MED ORDER — POVIDONE-IODINE 10 % EX SWAB
2.0000 "application " | Freq: Once | CUTANEOUS | Status: AC
  Administered 2020-02-07: 2 via TOPICAL

## 2020-02-07 MED ORDER — ALUM & MAG HYDROXIDE-SIMETH 200-200-20 MG/5ML PO SUSP
30.0000 mL | ORAL | Status: DC | PRN
  Administered 2020-02-08: 30 mL via ORAL
  Filled 2020-02-07: qty 30

## 2020-02-07 MED ORDER — DEXAMETHASONE SODIUM PHOSPHATE 10 MG/ML IJ SOLN
INTRAMUSCULAR | Status: DC | PRN
  Administered 2020-02-07: 10 mg

## 2020-02-07 MED ORDER — HYDROMORPHONE HCL 1 MG/ML IJ SOLN: 0.5000 mg | INTRAMUSCULAR | Status: DC | PRN

## 2020-02-07 MED ORDER — LIDOCAINE 2% (20 MG/ML) 5 ML SYRINGE
INTRAMUSCULAR | Status: DC | PRN
  Administered 2020-02-07: 60 mg via INTRAVENOUS

## 2020-02-07 MED ORDER — ALPRAZOLAM 1 MG PO TABS: 1.0000 mg | ORAL_TABLET | Freq: Every evening | ORAL | Status: DC | PRN

## 2020-02-07 MED ORDER — TRAMADOL HCL 50 MG PO TABS
50.0000 mg | ORAL_TABLET | Freq: Four times a day (QID) | ORAL | Status: DC
  Administered 2020-02-07 – 2020-02-08 (×3): 50 mg via ORAL
  Filled 2020-02-07 (×3): qty 1

## 2020-02-07 MED ORDER — FLEET ENEMA 7-19 GM/118ML RE ENEM: 1.0000 | ENEMA | Freq: Once | RECTAL | Status: DC | PRN

## 2020-02-07 MED ORDER — KETAMINE HCL 10 MG/ML IJ SOLN
INTRAMUSCULAR | Status: DC | PRN
  Administered 2020-02-07: 5 mg via INTRAVENOUS
  Administered 2020-02-07: 30 mg via INTRAVENOUS
  Administered 2020-02-07: 5 mg via INTRAVENOUS
  Administered 2020-02-07: 10 mg via INTRAVENOUS

## 2020-02-07 MED ORDER — BISACODYL 5 MG PO TBEC: 5.0000 mg | DELAYED_RELEASE_TABLET | Freq: Every day | ORAL | Status: DC | PRN

## 2020-02-07 MED ORDER — TRANEXAMIC ACID-NACL 1000-0.7 MG/100ML-% IV SOLN
1000.0000 mg | INTRAVENOUS | Status: AC
  Administered 2020-02-07: 1000 mg via INTRAVENOUS
  Filled 2020-02-07: qty 100

## 2020-02-07 MED ORDER — PROPOFOL 10 MG/ML IV BOLUS
INTRAVENOUS | Status: DC | PRN
  Administered 2020-02-07: 100 mg via INTRAVENOUS

## 2020-02-07 MED ORDER — FENTANYL CITRATE (PF) 100 MCG/2ML IJ SOLN
INTRAMUSCULAR | Status: DC | PRN
  Administered 2020-02-07 (×5): 50 ug via INTRAVENOUS
  Administered 2020-02-07: 100 ug via INTRAVENOUS

## 2020-02-07 MED ORDER — LEVOTHYROXINE SODIUM 88 MCG PO TABS
88.0000 ug | ORAL_TABLET | Freq: Every day | ORAL | Status: DC
  Administered 2020-02-08: 88 ug via ORAL
  Filled 2020-02-07: qty 1

## 2020-02-07 MED ORDER — ASPIRIN EC 325 MG PO TBEC
325.0000 mg | DELAYED_RELEASE_TABLET | Freq: Two times a day (BID) | ORAL | Status: DC
  Administered 2020-02-08: 325 mg via ORAL
  Filled 2020-02-07: qty 1

## 2020-02-07 MED ORDER — ACETAMINOPHEN 160 MG/5ML PO SOLN: 325.0000 mg | ORAL | Status: DC | PRN

## 2020-02-07 MED ORDER — ROSUVASTATIN CALCIUM 5 MG PO TABS
5.0000 mg | ORAL_TABLET | Freq: Every day | ORAL | Status: DC
  Administered 2020-02-07: 5 mg via ORAL
  Filled 2020-02-07: qty 1

## 2020-02-07 MED ORDER — SENNOSIDES-DOCUSATE SODIUM 8.6-50 MG PO TABS: 1.0000 | ORAL_TABLET | Freq: Every evening | ORAL | Status: DC | PRN

## 2020-02-07 MED ORDER — DEXAMETHASONE SODIUM PHOSPHATE 10 MG/ML IJ SOLN
10.0000 mg | Freq: Once | INTRAMUSCULAR | Status: AC
  Administered 2020-02-08: 10 mg via INTRAVENOUS
  Filled 2020-02-07: qty 1

## 2020-02-07 MED ORDER — GABAPENTIN 300 MG PO CAPS
300.0000 mg | ORAL_CAPSULE | Freq: Once | ORAL | Status: AC
  Administered 2020-02-07: 300 mg via ORAL
  Filled 2020-02-07: qty 1

## 2020-02-07 MED ORDER — FERROUS SULFATE 325 (65 FE) MG PO TABS
325.0000 mg | ORAL_TABLET | Freq: Three times a day (TID) | ORAL | Status: DC
  Administered 2020-02-07 – 2020-02-08 (×2): 325 mg via ORAL
  Filled 2020-02-07 (×2): qty 1

## 2020-02-07 MED ORDER — KETAMINE HCL 10 MG/ML IJ SOLN
INTRAMUSCULAR | Status: AC
  Filled 2020-02-07: qty 1

## 2020-02-07 MED ORDER — ONDANSETRON HCL 4 MG/2ML IJ SOLN
4.0000 mg | Freq: Four times a day (QID) | INTRAMUSCULAR | Status: DC | PRN
  Administered 2020-02-07: 4 mg via INTRAVENOUS
  Filled 2020-02-07: qty 2

## 2020-02-07 MED ORDER — CELECOXIB 200 MG PO CAPS
400.0000 mg | ORAL_CAPSULE | Freq: Once | ORAL | Status: AC
  Administered 2020-02-07: 400 mg via ORAL
  Filled 2020-02-07: qty 2

## 2020-02-07 MED ORDER — METOCLOPRAMIDE HCL 5 MG/ML IJ SOLN: 5.0000 mg | Freq: Three times a day (TID) | INTRAMUSCULAR | Status: DC | PRN

## 2020-02-07 MED ORDER — FENTANYL CITRATE (PF) 250 MCG/5ML IJ SOLN
INTRAMUSCULAR | Status: AC
  Filled 2020-02-07: qty 5

## 2020-02-07 MED ORDER — SUGAMMADEX SODIUM 200 MG/2ML IV SOLN
INTRAVENOUS | Status: DC | PRN
  Administered 2020-02-07: 110 mg via INTRAVENOUS

## 2020-02-07 MED ORDER — STERILE WATER FOR IRRIGATION IR SOLN
Status: DC | PRN
  Administered 2020-02-07: 2000 mL

## 2020-02-07 MED ORDER — ROPIVACAINE HCL 7.5 MG/ML IJ SOLN
INTRAMUSCULAR | Status: DC | PRN
  Administered 2020-02-07: 30 mL via PERINEURAL

## 2020-02-07 MED ORDER — METOCLOPRAMIDE HCL 5 MG PO TABS: 5.0000 mg | ORAL_TABLET | Freq: Three times a day (TID) | ORAL | Status: DC | PRN

## 2020-02-07 MED ORDER — DEXAMETHASONE SODIUM PHOSPHATE 10 MG/ML IJ SOLN
INTRAMUSCULAR | Status: DC | PRN
  Administered 2020-02-07: 6 mg via INTRAVENOUS

## 2020-02-07 MED ORDER — 0.9 % SODIUM CHLORIDE (POUR BTL) OPTIME
TOPICAL | Status: DC | PRN
  Administered 2020-02-07: 1000 mL

## 2020-02-07 MED ORDER — ACETAMINOPHEN 500 MG PO TABS
1000.0000 mg | ORAL_TABLET | Freq: Four times a day (QID) | ORAL | Status: DC
  Administered 2020-02-07 – 2020-02-08 (×3): 1000 mg via ORAL
  Filled 2020-02-07 (×3): qty 2

## 2020-02-07 MED ORDER — TRANEXAMIC ACID-NACL 1000-0.7 MG/100ML-% IV SOLN
1000.0000 mg | Freq: Once | INTRAVENOUS | Status: AC
  Administered 2020-02-07: 1000 mg via INTRAVENOUS
  Filled 2020-02-07: qty 100

## 2020-02-07 MED ORDER — DEXAMETHASONE SODIUM PHOSPHATE 10 MG/ML IJ SOLN: 8.0000 mg | Freq: Once | INTRAMUSCULAR | Status: DC

## 2020-02-07 MED ORDER — METHOCARBAMOL 500 MG IVPB - SIMPLE MED
500.0000 mg | Freq: Four times a day (QID) | INTRAVENOUS | Status: DC | PRN
  Filled 2020-02-07: qty 50

## 2020-02-07 MED ORDER — METHOCARBAMOL 500 MG PO TABS: 500.0000 mg | ORAL_TABLET | Freq: Four times a day (QID) | ORAL | Status: DC | PRN

## 2020-02-07 MED ORDER — ROCURONIUM BROMIDE 10 MG/ML (PF) SYRINGE
PREFILLED_SYRINGE | INTRAVENOUS | Status: AC
  Filled 2020-02-07: qty 10

## 2020-02-07 MED ORDER — OXYCODONE HCL 5 MG/5ML PO SOLN: 5.0000 mg | Freq: Once | ORAL | Status: DC | PRN

## 2020-02-07 MED ORDER — CEFAZOLIN SODIUM-DEXTROSE 2-4 GM/100ML-% IV SOLN
2.0000 g | Freq: Four times a day (QID) | INTRAVENOUS | Status: AC
  Administered 2020-02-07 – 2020-02-08 (×2): 2 g via INTRAVENOUS
  Filled 2020-02-07 (×2): qty 100

## 2020-02-07 MED ORDER — FENTANYL CITRATE (PF) 100 MCG/2ML IJ SOLN
50.0000 ug | INTRAMUSCULAR | Status: DC
  Administered 2020-02-07: 50 ug via INTRAVENOUS
  Filled 2020-02-07: qty 2

## 2020-02-07 MED ORDER — ZOLPIDEM TARTRATE 5 MG PO TABS: 5.0000 mg | ORAL_TABLET | Freq: Every evening | ORAL | Status: DC | PRN

## 2020-02-07 MED ORDER — ACETAMINOPHEN 325 MG PO TABS: 325.0000 mg | ORAL_TABLET | ORAL | Status: DC | PRN

## 2020-02-07 MED ORDER — OXYCODONE HCL 5 MG PO TABS: 5.0000 mg | ORAL_TABLET | Freq: Once | ORAL | Status: DC | PRN

## 2020-02-07 SURGICAL SUPPLY — 61 items
BAG ZIPLOCK 12X15 (MISCELLANEOUS) ×3 IMPLANT
BLADE FLEX CHISEL 8 2.5 (MISCELLANEOUS) ×3 IMPLANT
BLADE SAGITTAL 13X1.27X60 (BLADE) ×2 IMPLANT
BLADE SAGITTAL 13X1.27X60MM (BLADE) ×1
BLADE SAW SGTL 83.5X18.5 (BLADE) ×3 IMPLANT
BLADE SURG 15 STRL LF DISP TIS (BLADE) ×1 IMPLANT
BLADE SURG 15 STRL SS (BLADE) ×2
BLADE SURG SZ10 CARB STEEL (BLADE) ×12 IMPLANT
BNDG ELASTIC 6X15 VLCR STRL LF (GAUZE/BANDAGES/DRESSINGS) ×3 IMPLANT
BNDG ELASTIC 6X5.8 VLCR STR LF (GAUZE/BANDAGES/DRESSINGS) ×3 IMPLANT
BOWL SMART MIX CTS (DISPOSABLE) ×3 IMPLANT
CEMENT BONE SIMPLEX SPEEDSET (Cement) ×6 IMPLANT
CLOSURE WOUND 1/2 X4 (GAUZE/BANDAGES/DRESSINGS) ×2
COMPONENT CCK ART SURF LK 20 (Miscellaneous) ×3 IMPLANT
COMPONENT FEM STD SZ3 (Miscellaneous) ×3 IMPLANT
COMPONENT REV TIB FXD LT SZ B (Miscellaneous) ×3 IMPLANT
COVER SURGICAL LIGHT HANDLE (MISCELLANEOUS) ×3 IMPLANT
COVER WAND RF STERILE (DRAPES) ×3 IMPLANT
CUFF TOURN SGL QUICK 34 (TOURNIQUET CUFF) ×2
CUFF TRNQT CYL 34X4.125X (TOURNIQUET CUFF) ×1 IMPLANT
DECANTER SPIKE VIAL GLASS SM (MISCELLANEOUS) IMPLANT
DRAPE INCISE IOBAN 66X45 STRL (DRAPES) ×6 IMPLANT
DRAPE U-SHAPE 47X51 STRL (DRAPES) ×3 IMPLANT
DRSG AQUACEL AG ADV 3.5X10 (GAUZE/BANDAGES/DRESSINGS) ×3 IMPLANT
DURAPREP 26ML APPLICATOR (WOUND CARE) ×3 IMPLANT
ELECT REM PT RETURN 15FT ADLT (MISCELLANEOUS) ×3 IMPLANT
GLOVE BIOGEL M STRL SZ7.5 (GLOVE) ×3 IMPLANT
GLOVE BIOGEL PI IND STRL 7.5 (GLOVE) ×1 IMPLANT
GLOVE BIOGEL PI IND STRL 8.5 (GLOVE) ×2 IMPLANT
GLOVE BIOGEL PI INDICATOR 7.5 (GLOVE) ×2
GLOVE BIOGEL PI INDICATOR 8.5 (GLOVE) ×4
GLOVE SURG ORTHO 8.0 STRL STRW (GLOVE) ×9 IMPLANT
GOWN STRL REUS W/ TWL XL LVL3 (GOWN DISPOSABLE) ×1 IMPLANT
GOWN STRL REUS W/TWL XL LVL3 (GOWN DISPOSABLE) ×2
HANDPIECE INTERPULSE COAX TIP (DISPOSABLE) ×2
HOLDER FOLEY CATH W/STRAP (MISCELLANEOUS) ×3 IMPLANT
HOOD PEEL AWAY FLYTE STAYCOOL (MISCELLANEOUS) ×9 IMPLANT
KIT TURNOVER KIT A (KITS) IMPLANT
MANIFOLD NEPTUNE II (INSTRUMENTS) ×3 IMPLANT
NEEDLE HYPO 22GX1.5 SAFETY (NEEDLE) ×3 IMPLANT
NS IRRIG 1000ML POUR BTL (IV SOLUTION) ×3 IMPLANT
PACK TOTAL KNEE CUSTOM (KITS) ×3 IMPLANT
PENCIL SMOKE EVACUATOR (MISCELLANEOUS) ×3 IMPLANT
PROTECTOR NERVE ULNAR (MISCELLANEOUS) ×3 IMPLANT
SET HNDPC FAN SPRY TIP SCT (DISPOSABLE) ×1 IMPLANT
STAPLER VISISTAT 35W (STAPLE) IMPLANT
STEM EXT FEM STRAIGHT 13X135 (Miscellaneous) ×3 IMPLANT
STEM EXT KNEE 6 OFFSET 15X135 (Miscellaneous) ×3 IMPLANT
STRIP CLOSURE SKIN 1/2X4 (GAUZE/BANDAGES/DRESSINGS) ×4 IMPLANT
SUT BONE WAX W31G (SUTURE) ×3 IMPLANT
SUT MNCRL AB 3-0 PS2 18 (SUTURE) ×3 IMPLANT
SUT STRATAFIX 0 PDS 27 VIOLET (SUTURE) ×3
SUT STRATAFIX PDS+ 0 24IN (SUTURE) ×3 IMPLANT
SUT VIC AB 1 CT1 36 (SUTURE) ×3 IMPLANT
SUTURE STRATFX 0 PDS 27 VIOLET (SUTURE) ×1 IMPLANT
SYR CONTROL 10ML LL (SYRINGE) ×6 IMPLANT
TRAY FOLEY MTR SLVR 14FR STAT (SET/KITS/TRAYS/PACK) ×3 IMPLANT
TRAY FOLEY MTR SLVR 16FR STAT (SET/KITS/TRAYS/PACK) IMPLANT
WATER STERILE IRR 1000ML POUR (IV SOLUTION) ×3 IMPLANT
WEDGE PSN REV FEM DIST SZ3 5 (Miscellaneous) ×6 IMPLANT
YANKAUER SUCT BULB TIP 10FT TU (MISCELLANEOUS) ×3 IMPLANT

## 2020-02-07 NOTE — Anesthesia Preprocedure Evaluation (Addendum)
Anesthesia Evaluation  Patient identified by MRN, date of birth, ID band Patient awake    Reviewed: Allergy & Precautions, H&P , NPO status , Patient's Chart, lab work & pertinent test results  History of Anesthesia Complications (+) PONV and history of anesthetic complications  Airway Mallampati: I  TM Distance: >3 FB Neck ROM: full    Dental  (+) Teeth Intact, Dental Advisory Given   Pulmonary neg pulmonary ROS, former smoker,        Pulmonary exam normal breath sounds clear to auscultation       Cardiovascular Exercise Tolerance: Good hypertension, Pt. on medications  Rhythm:regular Rate:Normal  CV: Echo 01/13/20 Conclusions:  1. Normal biventricular dimensions and systolic function.  2. Degenerative changes of the mitral valve as noted with trivial to mild regurgitation 3. Mild pulmonary hypertension 4. Incidental finding of an approximately 4 x 6 cm simple hepatic cyst.    Neuro/Psych negative neurological ROS     GI/Hepatic hiatal hernia, GERD  Medicated,(+) Hepatitis -  Endo/Other  Hypothyroidism   Renal/GU      Musculoskeletal  (+) Arthritis , Osteoarthritis,    Abdominal   Peds  Hematology negative hematology ROS (+)   Anesthesia Other Findings   Reproductive/Obstetrics                           Anesthesia Physical  Anesthesia Plan  ASA: III  Anesthesia Plan: General   Post-op Pain Management:  Regional for Post-op pain   Induction: Intravenous  PONV Risk Score and Plan: 3 and Ondansetron  Airway Management Planned: Nasal Cannula, Oral ETT and LMA  Additional Equipment:   Intra-op Plan:   Post-operative Plan: Extubation in OR  Informed Consent: I have reviewed the patients History and Physical, chart, labs and discussed the procedure including the risks, benefits and alternatives for the proposed anesthesia with the patient or authorized representative who has  indicated his/her understanding and acceptance.       Plan Discussed with: Anesthesiologist, Surgeon and CRNA  Anesthesia Plan Comments: (  )     Anesthesia Quick Evaluation

## 2020-02-07 NOTE — Evaluation (Signed)
Physical Therapy Evaluation Patient Details Name: Anne Pearson MRN: [id number] DOB: 01-Apr-1941 Today's Date: 02/07/2020   History of Present Illness  Patient is 79 y.o. female s/p Lt TKR on 02/07/20 with PMH significant for tachycardia, OA, IBS, HTN, hypothyroidism, GERD, anxiety, Rt THA, Rt UKA, Lt TKA with subsequent I&D with poly exchange in 2015.    Clinical Impression  Anne Pearson is a 79 y.o. female POD 0 s/p Lt TKR. Patient reports independence with mobility at baseline. Patient is now limited by functional impairments (see PT problem list below) and requires min assist for transfers and gait with RW. Patient was able to ambulate ~70 feet with RW and min guard/assist. Patient instructed in exercise to facilitate ROM and circulation. Patient will benefit from continued skilled PT interventions to address impairments and progress towards PLOF. Acute PT will follow to progress mobility and stair training in preparation for safe discharge home.     Follow Up Recommendations Follow surgeon's recommendation for DC plan and follow-up therapies    Equipment Recommendations  None recommended by PT(pt has all equipment needed)    Recommendations for Other Services       Precautions / Restrictions Precautions Precautions: Fall Restrictions Weight Bearing Restrictions: No LLE Weight Bearing: Weight bearing as tolerated      Mobility  Bed Mobility Overal bed mobility: Needs Assistance Bed Mobility: Supine to Sit     Supine to sit: Supervision;HOB elevated     General bed mobility comments: pt moved to long sitting position and then pivoted to EOB, no assist required, use of bed rails and some extra time needed.  Transfers Overall transfer level: Needs assistance Equipment used: Rolling walker (2 wheeled) Transfers: Sit to/from Stand Sit to Stand: Min assist         General transfer comment: cues for safe hand placement and technique with RW, assist requried for power up  and to steady with rising  Ambulation/Gait Ambulation/Gait assistance: Min assist;Min guard Gait Distance (Feet): 70 Feet Assistive device: Rolling walker (2 wheeled) Gait Pattern/deviations: Step-to pattern;Decreased stride length;Decreased stance time - left;Decreased weight shift to left Gait velocity: decreased   General Gait Details: cues for safe step pattern and proximity to RW, no overt LOB noted throughout, pt safe with RW management.  Stairs       Wheelchair Mobility    Modified Rankin (Stroke Patients Only)       Balance Overall balance assessment: Needs assistance Sitting-balance support: Feet supported Sitting balance-Leahy Scale: Good     Standing balance support: During functional activity;Bilateral upper extremity supported Standing balance-Leahy Scale: Fair          Pertinent Vitals/Pain Pain Assessment: 0-10 Pain Score: 3  Pain Location: Lt knee Pain Descriptors / Indicators: Aching;Sore Pain Intervention(s): Limited activity within patient's tolerance;Monitored during session;Repositioned    Home Living Family/patient expects to be discharged to:: Private residence Living Arrangements: Spouse/significant other Available Help at Discharge: Family;Available 24 hours/day Type of Home: House Home Access: Stairs to enter Entrance Stairs-Rails: None Entrance Stairs-Number of Steps: 1 Home Layout: One level;Laundry or work area in [place]: Environmental consultant - 2 wheels;Cane - single point;Shower seat - built in Additional Comments: Pt's daughter is up from [facility] to assist her for ~1 week and then goes back to [facility]. Pts husband can assist her as well and her son lives close by and will take over helping when his sister goes back to [facility].    Prior Function Level of Independence: Independent  Comments: pt enjoys walking for exercise and gardening.     Hand Dominance   Dominant Hand: Left    Extremity/Trunk Assessment   Upper Extremity  Assessment Upper Extremity Assessment: Overall WFL for tasks assessed    Lower Extremity Assessment Lower Extremity Assessment: LLE deficits/detail LLE Deficits / Details: good quad activation, no extensor lag with SLR, 4/5 for quad strength LLE Sensation: WNL LLE Coordination: WNL    Cervical / Trunk Assessment Cervical / Trunk Assessment: Normal  Communication   Communication: No difficulties  Cognition Arousal/Alertness: Awake/alert Behavior During Therapy: WFL for tasks assessed/performed Overall Cognitive Status: Within Functional Limits for tasks assessed         General Comments      Exercises Total Joint Exercises Ankle Circles/Pumps: AROM;15 reps;Both;Seated Quad Sets: AROM;Left;5 reps;Seated Heel Slides: AROM;5 reps;Left;Seated   Assessment/Plan    PT Assessment Patient needs continued PT services  PT Problem List Decreased strength;Decreased range of motion;Decreased activity tolerance;Decreased balance;Decreased mobility;Decreased cognition;Decreased knowledge of use of DME       PT Treatment Interventions DME instruction;Gait training;Stair training;Functional mobility training;Therapeutic activities;Therapeutic exercise;Balance training;Patient/family education    PT Goals (Current goals can be found in the Care Plan section)  Acute Rehab PT Goals Patient Stated Goal: to get back to gardening and taking dogs out. to be able to travel again without havin pain in Lt knee. PT Goal Formulation: With patient Time For Goal Achievement: 02/14/20 Potential to Achieve Goals: Good    Frequency 7X/week    AM-PAC PT "6 Clicks" Mobility  Outcome Measure Help needed turning from your back to your side while in a flat bed without using bedrails?: A Little Help needed moving from lying on your back to sitting on the side of a flat bed without using bedrails?: A Little Help needed moving to and from a bed to a chair (including a wheelchair)?: A Little Help needed  standing up from a chair using your arms (e.g., wheelchair or bedside chair)?: A Little Help needed to walk in hospital room?: A Little Help needed climbing 3-5 steps with a railing? : A Little 6 Click Score: 18    End of Session Equipment Utilized During Treatment: Gait belt Activity Tolerance: Patient tolerated treatment well Patient left: in chair;with call bell/phone within reach;with chair alarm set;with family/visitor present Nurse Communication: Mobility status PT Visit Diagnosis: Muscle weakness (generalized) (M62.81);Difficulty in walking, not elsewhere classified (R26.2)    Time: 1710-1740 PT Time Calculation (min) (ACUTE ONLY): 30 min   Charges:   PT Evaluation $PT Eval Low Complexity: 1 Low PT Treatments $Therapeutic Exercise: 8-22 mins        Verner Mould, DPT Physical Therapist with [facility] [phone]  02/07/2020 5:59 PM

## 2020-02-07 NOTE — Anesthesia Postprocedure Evaluation (Signed)
Anesthesia Post Note  Patient: Anne Pearson  Procedure(s) Performed: TOTAL KNEE REVISION (Left Knee)     Patient location during evaluation: PACU Anesthesia Type: General Level of consciousness: awake Pain management: pain level controlled Vital Signs Assessment: post-procedure vital signs reviewed and stable Respiratory status: spontaneous breathing Cardiovascular status: stable Postop Assessment: no apparent nausea or vomiting Anesthetic complications: no    Last Vitals:  Vitals:   02/07/20 1037 02/07/20 1038  BP: 116/60   Pulse: (!) 50 (!) 49  Resp: 12 14  Temp:    SpO2: 100% 100%    Last Pain:  Vitals:   02/07/20 1027  TempSrc:   PainSc: 0-No pain                 Jalyah Weinheimer

## 2020-02-07 NOTE — Anesthesia Procedure Notes (Signed)
Procedure Name: Intubation Date/Time: 02/07/2020 11:23 AM Performed by: Sharlette Dense, CRNA Patient Re-evaluated:Patient Re-evaluated prior to induction Oxygen Delivery Method: Circle system utilized Preoxygenation: Pre-oxygenation with 100% oxygen Induction Type: IV induction Ventilation: Mask ventilation without difficulty and Oral airway inserted - appropriate to patient size Laryngoscope Size: Sabra Heck and 2 Grade View: Grade I Tube type: Oral Tube size: 7.0 mm Number of attempts: 1 Airway Equipment and Method: Stylet Placement Confirmation: ETT inserted through vocal cords under direct vision,  positive ETCO2 and breath sounds checked- equal and bilateral Secured at: 21 cm Tube secured with: Tape Dental Injury: Teeth and Oropharynx as per pre-operative assessment

## 2020-02-07 NOTE — Transfer of Care (Signed)
Immediate Anesthesia Transfer of Care Note  Patient: Anne Pearson  Procedure(s) Performed: TOTAL KNEE REVISION (Left Knee)  Patient Location: PACU  Anesthesia Type:GA combined with regional for post-op pain  Level of Consciousness: awake, alert  and oriented  Airway & Oxygen Therapy: Patient Spontanous Breathing and Patient connected to face mask oxygen  Post-op Assessment: Report given to RN and Post -op Vital signs reviewed and stable  Post vital signs: Reviewed and stable  Last Vitals:  Vitals Value Taken Time  BP 134/68 02/07/20 1432  Temp    Pulse 88 02/07/20 1434  Resp 17 02/07/20 1434  SpO2 100 % 02/07/20 1434  Vitals shown include unvalidated device data.  Last Pain:  Vitals:   02/07/20 1027  TempSrc:   PainSc: 0-No pain      Patients Stated Pain Goal: 3 ([id number])  Complications: No apparent anesthesia complications

## 2020-02-07 NOTE — Progress Notes (Signed)
Assisted Dr. Oddono with left, ultrasound guided, adductor canal block. Side rails up, monitors on throughout procedure. See vital signs in flow sheet. Tolerated Procedure well.  

## 2020-02-07 NOTE — H&P (Signed)
Chua Savoy MRN:  [id number] DOB/SEX:  27-May-1941/female  CHIEF COMPLAINT:  Painful left Knee  HISTORY: Patient is a 79 y.o. female presented with a history of pain in the left knee. Onset of symptoms was gradual starting a few years ago with gradually worsening course since that time. Patient has been treated conservatively with over-the-counter NSAIDs and activity modification. Patient currently rates pain in the knee at 10 out of 10 with activity. There is pain at night.  PAST MEDICAL HISTORY: Patient Active Problem List   Diagnosis Date Noted  . Left knee pain 12/20/2013   Past Medical History:  Diagnosis Date  . Basal cell carcinoma    face  . Cataract    Small  . DDD (degenerative disc disease), cervical   . GAD (generalized anxiety disorder)   . GERD (gastroesophageal reflux disease)   . Hepatitis    Drug induced  . History of carpal tunnel release    Left   . History of hiatal hernia   . History of iron deficiency anemia    in childhood  . History of multinodular goiter   . Hypercholesterolemia   . Hypertension   . Hypothyroidism   . IBS (irritable bowel syndrome)   . OA (osteoarthritis)   . Pneumonia    childhood  . PONV (postoperative nausea and vomiting)   . Restless leg syndrome   . S/P radioactive iodine thyroid ablation   . Tachycardia    Past Surgical History:  Procedure Laterality Date  . ABDOMINAL HYSTERECTOMY  1988  . APPENDECTOMY    . BLADDER SUSPENSION  2011  . BREAST LUMPECTOMY Left   . CARPAL TUNNEL RELEASE Left   . COLONOSCOPY    . COLONOSCOPY    . I & D KNEE WITH POLY EXCHANGE Left 12/20/2013   Procedure: IRRIGATION AND DEBRIDEMENT KNEE WITH POLY EXCHANGE;  Surgeon: Vickey Huger, MD;  Location: [place];  Service: Orthopedics;  Laterality: Left;  . INGUINAL HERNIA REPAIR Bilateral   . JOINT REPLACEMENT Left 2012  . PARTIAL KNEE ARTHROPLASTY Right 01/04/2013   Procedure: PARTIAL KNEE REPLACEMENT/UNICOMPARTMENTAL MEDIAL;  Surgeon: Vickey Huger,  MD;  Location: [place];  Service: Orthopedics;  Laterality: Right;  . TONSILLECTOMY AND ADENOIDECTOMY  1947  . TOTAL HIP ARTHROPLASTY Right   . TOTAL KNEE ARTHROPLASTY Left   . UPPER GI ENDOSCOPY       MEDICATIONS:   Medications Prior to Admission  Medication Sig Dispense Refill Last Dose  . acetaminophen (TYLENOL) 650 MG CR tablet Take 650-1,300 mg by mouth every 8 (eight) hours as needed (knee pain.).     Marland Kitchen ALPRAZolam (XANAX) 1 MG tablet Take 1 mg by mouth at bedtime as needed and may repeat dose one time if needed.     . Ascorbic Acid (VITAMIN C WITH ROSE HIPS) 500 MG tablet Take 500 mg by mouth daily.     . Biotin 5000 MCG TABS Take 5,000 mcg by mouth daily.     . calcium citrate-vitamin D 500-400 MG-UNIT chewable tablet Chew 1 tablet by mouth daily.     . cholecalciferol (VITAMIN D3) 25 MCG (1000 UNIT) tablet Take 1,000 Units by mouth daily.     . fluticasone (FLONASE) 50 MCG/ACT nasal spray Place 2 sprays into both nostrils at bedtime.     . hydrocortisone 2.5 % lotion Apply 1 application topically 2 (two) times daily as needed (Granuloma annulare).      . lansoprazole (PREVACID) 15 MG capsule Take 15 mg by mouth every  evening.      Marland Kitchen levothyroxine (SYNTHROID) 88 MCG tablet Take 88 mcg by mouth daily before breakfast.     . lisinopril (PRINIVIL,ZESTRIL) 10 MG tablet Take 10 mg by mouth daily.     Marland Kitchen loratadine (CLARITIN) 10 MG tablet Take 10 mg by mouth daily. [employer], operational     . metoprolol tartrate (LOPRESSOR) 25 MG tablet Take 25 mg by mouth daily.     . metroNIDAZOLE (METROCREAM) 0.75 % cream Apply 1 application topically daily. Applied to nose & under eyes     . pramipexole (MIRAPEX) 0.25 MG tablet Take 0.5 mg by mouth at bedtime.     . rosuvastatin (CRESTOR) 5 MG tablet Take 5 mg by mouth at bedtime.     . triamcinolone cream (KENALOG) 0.1 % Apply 1 application topically 2 (two) times daily as needed (Granuloma annulare).        ALLERGIES:   Allergies  Allergen  Reactions  . Macrodantin [Nitrofurantoin] Other (See Comments)    Caused hepatitis   . Codeine Nausea And Vomiting  . Other     Red meat   . Meloxicam Rash    REVIEW OF SYSTEMS:  A comprehensive review of systems was negative except for: Musculoskeletal: positive for arthralgias and bone pain   FAMILY HISTORY:  No family history on file.  SOCIAL HISTORY:   Social History   Tobacco Use  . Smoking status: Former Smoker    Packs/day: 1.00    Years: 30.00    Pack years: 30.00    Types: Cigarettes    Quit date: 11/11/1997    Years since quitting: 22.2  . Smokeless tobacco: Never Used  Substance Use Topics  . Alcohol use: Yes    Comment: social     EXAMINATION:  Vital signs in last 24 hours: Temp:  [97.8 F (36.6 C)] 97.8 F (36.6 C) (03/29 0914) Pulse Rate:  [62] 62 (03/29 0914) Resp:  [16] 16 (03/29 0914) BP: (164)/(51) 164/51 (03/29 0914) SpO2:  [99 %] 99 % (03/29 0914)  BP (!) 164/51   Pulse 62   Temp 97.8 F (36.6 C) (Oral)   Resp 16   SpO2 99%   General Appearance:    Alert, cooperative, no distress, appears stated age  Head:    Normocephalic, without obvious abnormality, atraumatic  Eyes:    PERRL, conjunctiva/corneas clear, EOM's intact, fundi    benign, both eyes  Ears:    Normal TM's and external ear canals, both ears  Nose:   Nares normal, septum midline, mucosa normal, no drainage    or sinus tenderness  Throat:   Lips, mucosa, and tongue normal; teeth and gums normal  Neck:   Supple, symmetrical, trachea midline, no adenopathy;    thyroid:  no enlargement/tenderness/nodules; no carotid   bruit or JVD  Back:     Symmetric, no curvature, ROM normal, no CVA tenderness  Lungs:     Clear to auscultation bilaterally, respirations unlabored  Chest Wall:    No tenderness or deformity   Heart:    Regular rate and rhythm, S1 and S2 normal, no murmur, rub   or gallop  Breast Exam:    No tenderness, masses, or nipple abnormality  Abdomen:     Soft,  non-tender, bowel sounds active all four quadrants,    no masses, no organomegaly  Genitalia:    Normal female without lesion, discharge or tenderness  Rectal:    Normal tone, no masses or tenderness;  guaiac negative stool  Extremities:   Extremities normal, atraumatic, no cyanosis or edema  Pulses:   2+ and symmetric all extremities  Skin:   Skin color, texture, turgor normal, no rashes or lesions  Lymph nodes:   Cervical, supraclavicular, and axillary nodes normal  Neurologic:   CNII-XII intact, normal strength, sensation and reflexes    throughout    Musculoskeletal:  ROM 0-120, Ligaments intact,  Imaging Review Plain radiographs demonstrate loosening/failed TKA of the  left knee. The overall alignment is neutral. The bone quality appears to be good for age and reported activity level.  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 The patient has failed conservative treatment.  The clearance notes were reviewed.  After discussion with the patient it was felt that Total Knee Revision was indicated. The procedure,  risks, and benefits of total knee revision were presented and reviewed. The risks including but not limited to aseptic loosening, infection, blood clots, vascular injury, stiffness, patella tracking problems complications among others were discussed. The patient acknowledged the explanation, agreed to proceed with the plan.  Preoperative templating of the joint replacement has been completed, documented, and submitted to the Operating Room personnel in order to optimize intra-operative equipment management.    Patient's anticipated LOS is less than 2 midnights, meeting these requirements: - Lives within 1 hour of care - Has a competent adult at home to recover with post-op recover - NO history of  - Chronic pain requiring opiods  -  Diabetes  - Coronary Artery Disease  - Heart failure  - Heart attack  - Stroke  - DVT/VTE  - Cardiac arrhythmia  - Respiratory Failure/COPD  - Renal failure  - Anemia  - Advanced Liver disease       Donia Ast 02/07/2020, 9:14 AM

## 2020-02-07 NOTE — Anesthesia Procedure Notes (Signed)
Anesthesia Regional Block: Adductor canal block   Pre-Anesthetic Checklist: ,, timeout performed, Correct Patient, Correct Site, Correct Laterality, Correct Procedure, Correct Position, site marked, Risks and benefits discussed,  Surgical consent,  Pre-op evaluation,  At surgeon's request and post-op pain management  Laterality: Left  Prep: chloraprep       Needles:  Injection technique: Single-shot  Needle Type: Echogenic Stimulator Needle     Needle Length: 5cm  Needle Gauge: 22     Additional Needles:   Procedures:, nerve stimulator,,, ultrasound used (permanent image in chart),,,,  Narrative:  Start time: 02/07/2020 10:22 AM End time: 02/07/2020 10:27 AM Injection made incrementally with aspirations every 5 mL.  Performed by: Personally  Anesthesiologist: Janeece Riggers, MD  Additional Notes: Functioning IV was confirmed and monitors were applied.  A 71mm 22ga Arrow echogenic stimulator needle was used. Sterile prep and drape,hand hygiene and sterile gloves were used. Ultrasound guidance: relevant anatomy identified, needle position confirmed, local anesthetic spread visualized around nerve(s)., vascular puncture avoided.  Image printed for medical record. Negative aspiration and negative test dose prior to incremental administration of local anesthetic. The patient tolerated the procedure well.

## 2020-02-08 ENCOUNTER — Encounter (HOSPITAL_COMMUNITY): Payer: Self-pay | Admitting: Orthopedic Surgery

## 2020-02-08 DIAGNOSIS — T84033A Mechanical loosening of internal left knee prosthetic joint, initial encounter: Secondary | ICD-10-CM | POA: Diagnosis not present

## 2020-02-08 LAB — BASIC METABOLIC PANEL
Anion gap: 6 (ref 5–15)
BUN: 19 mg/dL (ref 8–23)
CO2: 27 mmol/L (ref 22–32)
Calcium: 8.3 mg/dL — ABNORMAL LOW (ref 8.9–10.3)
Chloride: 105 mmol/L (ref 98–111)
Creatinine, Ser: 0.59 mg/dL (ref 0.44–1.00)
GFR calc Af Amer: >60 mL/min (ref >60)
GFR calc non Af Amer: >60 mL/min (ref >60)
Glucose, Bld: 211 mg/dL — ABNORMAL HIGH (ref 70–99)
Potassium: 3.6 mmol/L (ref 3.5–5.1)
Sodium: 138 mmol/L (ref 135–145)

## 2020-02-08 LAB — CBC
HCT: 33 % — ABNORMAL LOW (ref 36.0–46.0)
Hemoglobin: 10.5 g/dL — ABNORMAL LOW (ref 12.0–15.0)
MCH: 29.3 pg (ref 26.0–34.0)
MCHC: 31.8 g/dL (ref 30.0–36.0)
MCV: 92.2 fL (ref 80.0–100.0)
Platelets: 156 10*3/uL (ref 150–400)
RBC: 3.58 MIL/uL — ABNORMAL LOW (ref 3.87–5.11)
RDW: 12 % (ref 11.5–15.5)
WBC: 6.3 10*3/uL (ref 4.0–10.5)
nRBC: 0 % (ref 0.0–0.2)

## 2020-02-08 MED ORDER — ASPIRIN 325 MG PO TBEC: 325.0000 mg | DELAYED_RELEASE_TABLET | Freq: Two times a day (BID) | ORAL | 0 refills | Status: DC

## 2020-02-08 MED ORDER — METHOCARBAMOL 500 MG PO TABS: 500.0000 mg | ORAL_TABLET | Freq: Four times a day (QID) | ORAL | 0 refills | Status: DC | PRN

## 2020-02-08 MED ORDER — OXYCODONE HCL 5 MG PO TABS: 5.0000 mg | ORAL_TABLET | Freq: Four times a day (QID) | ORAL | 0 refills | Status: DC | PRN

## 2020-02-08 NOTE — TOC Transition Note (Signed)
Transition of Care [facility]) - CM/SW Discharge Note   Patient Details  Name: Anne Pearson MRN: [id number] Date of Birth: Mar 06, 1941  [facility] CM/SW Contact:  Lennart Pall, LCSW Phone Number: 02/08/2020, 10:17 AM   Clinical Narrative:   Pt cleared for d/c today.  Confirmed with her that she already has appointments for OPPT at [facility] in [place] and has DME.  No further needs.    Final next level of care: (OPPT already arranged at [facility] in [place], [place]) Barriers to Discharge: Barriers Resolved   Patient Goals and CMS Choice        Discharge Placement                       Discharge Plan and Services                DME Arranged: N/A(has all needed DME)                    Social Determinants of Health (SDOH) Interventions     Readmission Risk Interventions No flowsheet data found.

## 2020-02-08 NOTE — Discharge Summary (Signed)
SPORTS MEDICINE & JOINT REPLACEMENT   Anne Mulch, MD   Carlyon Shadow, PA-C [place], [place], [place]  [address]                             [phone]  PATIENT ID: Anne Pearson        MRN:  [id number]          DOB/AGE: 03/26/1941 / 79 y.o.    DISCHARGE SUMMARY  ADMISSION DATE:    02/07/2020 DISCHARGE DATE:   02/08/2020   ADMISSION DIAGNOSIS: S/P revision of total knee [Z96.659]    DISCHARGE DIAGNOSIS:  Failed Total knee arthroplasty    ADDITIONAL DIAGNOSIS: Active Problems:   S/P revision of total knee  Past Medical History:  Diagnosis Date  . Basal cell carcinoma    face  . Cataract    Small  . DDD (degenerative disc disease), cervical   . GAD (generalized anxiety disorder)   . GERD (gastroesophageal reflux disease)   . Hepatitis    Drug induced  . History of carpal tunnel release    Left   . History of hiatal hernia   . History of iron deficiency anemia    in childhood  . History of multinodular goiter   . Hypercholesterolemia   . Hypertension   . Hypothyroidism   . IBS (irritable bowel syndrome)   . OA (osteoarthritis)   . Pneumonia    childhood  . PONV (postoperative nausea and vomiting)   . Restless leg syndrome   . S/P radioactive iodine thyroid ablation   . Tachycardia     PROCEDURE: Procedure(s): TOTAL KNEE REVISION on 02/07/2020  CONSULTS:    HISTORY:  See H&P in chart  HOSPITAL COURSE:  Anne Pearson is a 79 y.o. admitted on 02/07/2020 and found to have a diagnosis of Failed Total knee arthroplasty.  After appropriate laboratory studies were obtained  they were taken to the operating room on 02/07/2020 and underwent Procedure(s): [place].   They were given perioperative antibiotics:  Anti-infectives (From admission, onward)   Start     Dose/Rate Route Frequency Ordered Stop   02/07/20 1800  ceFAZolin (ANCEF) IVPB 2g/100 mL premix     2 g 200 mL/hr over 30 Minutes Intravenous Every 6 hours 02/07/20 1536 02/08/20  0030   02/07/20 0900  ceFAZolin (ANCEF) IVPB 2g/100 mL premix     2 g 200 mL/hr over 30 Minutes Intravenous On call to O.R. 02/07/20 ID:[id number] 02/07/20 1135    .  Patient given tranexamic acid IV or topical and exparel intra-operatively.  Tolerated the procedure well.    POD# 1: Vital signs were stable.  Patient denied Chest pain, shortness of breath, or calf pain.  Patient was started on Aspirin twice daily at 8am.  Consults to PT, OT, and care management were made.  The patient was weight bearing as tolerated.  CPM was placed on the operative leg 0-90 degrees for 6-8 hours a day. When out of the CPM, patient was placed in the foam block to achieve full extension. Incentive spirometry was taught.  Dressing was changed.       POD #2, Continued  PT for ambulation and exercise program.  IV saline locked.  O2 discontinued.    The remainder of the hospital course was dedicated to ambulation and strengthening.   The patient was discharged on 1 Day Post-Op in  Good condition.  Blood products given:none  DIAGNOSTIC  STUDIES: Recent vital signs:  Patient Vitals for the past 24 hrs:  BP Temp Temp src Pulse Resp SpO2 Height Weight  02/08/20 0524 (!) 109/52 97.9 F (36.6 C) Axillary 63 18 100 % -- --  02/08/20 0156 125/62 97.7 F (36.5 C) Axillary 74 16 100 % -- --  02/07/20 2155 (!) 128/55 97.7 F (36.5 C) Oral 71 15 97 % -- --  02/07/20 1852 (!) 152/66 (!) 97.4 F (36.3 C) Oral 74 16 99 % -- --  02/07/20 1811 (!) 147/73 97.8 F (36.6 C) -- 73 16 100 % -- --  02/07/20 1649 (!) 144/68 97.6 F (36.4 C) -- 66 16 100 % -- --  02/07/20 1540 125/62 97.8 F (36.6 C) -- 76 16 100 % -- --  02/07/20 1530 (!) 125/57 98.5 F (36.9 C) -- 84 16 100 % -- --  02/07/20 1515 118/63 -- -- 79 14 99 % -- --  02/07/20 1500 114/62 -- -- 72 10 100 % -- --  02/07/20 1445 (!) 119/57 -- -- 75 17 99 % -- --  02/07/20 1433 134/68 98.7 F (37.1 C) -- 89 13 100 % -- --  02/07/20 1038 -- -- -- (!) 49 14 100 % -- --   02/07/20 1037 116/60 -- -- (!) 50 12 100 % -- --  02/07/20 1036 -- -- -- (!) 51 18 100 % -- --  02/07/20 1035 -- -- -- (!) 51 11 100 % -- --  02/07/20 1034 -- -- -- (!) 50 14 100 % -- --  02/07/20 1033 -- -- -- (!) 50 14 100 % -- --  02/07/20 1032 (!) 117/52 -- -- (!) 49 (!) 22 100 % -- --  02/07/20 1031 -- -- -- (!) 49 17 100 % -- --  02/07/20 1030 -- -- -- (!) 51 18 100 % -- --  02/07/20 1029 -- -- -- (!) 49 15 100 % -- --  02/07/20 1028 -- -- -- (!) 54 18 100 % -- --  02/07/20 1027 113/68 -- -- (!) 55 16 100 % -- --  02/07/20 1026 -- -- -- (!) 56 18 100 % -- --  02/07/20 1025 -- -- -- (!) 56 20 100 % -- --  02/07/20 1024 -- -- -- (!) 52 20 100 % -- --  02/07/20 1023 -- -- -- (!) 51 15 100 % -- --  02/07/20 1022 (!) 137/57 -- -- (!) 56 17 100 % -- --  02/07/20 1021 -- -- -- (!) 55 16 100 % -- --  02/07/20 1020 -- -- -- -- 14 -- -- --  02/07/20 1019 -- -- -- -- (!) 25 -- -- --  02/07/20 1018 -- -- -- -- 14 -- -- --  02/07/20 1017 134/60 -- -- (!) 46 17 93 % -- --  02/07/20 0927 -- -- -- -- -- -- 5' 4.5" (1.638 m) 55.9 kg  02/07/20 0914 (!) 164/51 97.8 F (36.6 C) Oral 62 16 99 % -- --       Recent laboratory studies: Recent Labs    02/01/20 0956 02/08/20 0349  WBC 4.2 6.3  HGB 12.7 10.5*  HCT 40.5 33.0*  PLT 186 156   Recent Labs    02/01/20 0956 02/08/20 0349  NA 140 138  K 4.9 3.6  CL 105 105  CO2 28 27  BUN 19 19  CREATININE 0.62 0.59  GLUCOSE 104* 211*  CALCIUM 9.4 8.3*   Lab Results  Component  Value Date   INR 0.96 12/13/2013   INR 0.94 12/31/2012     Recent Radiographic Studies :  No results found.  DISCHARGE INSTRUCTIONS:   DISCHARGE MEDICATIONS:     FOLLOW UP VISIT:    DISPOSITION: HOME VS. SNF  CONDITION:  Good   Donia Ast 02/08/2020, 7:43 AM

## 2020-02-08 NOTE — Progress Notes (Signed)
Physical Therapy Treatment Patient Details Name: Anne Pearson MRN: [id number] DOB: Sep 13, 1941 Today's Date: 02/08/2020    History of Present Illness Patient is 79 y.o. female s/p Lt TKR on 02/07/20 with PMH significant for tachycardia, OA, IBS, HTN, hypothyroidism, GERD, anxiety, Rt THA, Rt UKA, Lt TKA with subsequent I&D with poly exchange in 2015.    PT Comments    POD # 1 assisted OOB.  General bed mobility comments: demonstarted and instructed how to use a belt to self assist LE off bed.  Assisted to bathroom.  Assisted with amb in hallway.  General Gait Details: < 25% VC's on proper walker to self distance and safety with turns.  Practiced one step she has to enter her home.  Then returned to room to perform some TE's following HEP handout.  Instructed on proper tech, freq as well as use of ICE.   Addressed all mobility questions, discussed appropriate activity, educated on use of ICE.  Pt ready for D/C to home.   Follow Up Recommendations  Follow surgeon's recommendation for DC plan and follow-up therapies     Equipment Recommendations  None recommended by PT    Recommendations for Other Services       Precautions / Restrictions Precautions Precautions: Fall Precaution Comments: reviewed no pillow under knee Restrictions Weight Bearing Restrictions: No LLE Weight Bearing: Weight bearing as tolerated    Mobility  Bed Mobility Overal bed mobility: Needs Assistance Bed Mobility: Supine to Sit     Supine to sit: Supervision;HOB elevated     General bed mobility comments: demonstarted and instructed how to use a belt to self assist LE off bed  Transfers Overall transfer level: Needs assistance Equipment used: Rolling walker (2 wheeled) Transfers: Sit to/from [facility] Sit to Stand: Supervision Stand pivot transfers: Supervision;Min guard       General transfer comment: one VC safety with turns.  Also assisted with toilet  transfer.  Ambulation/Gait Ambulation/Gait assistance: Supervision Gait Distance (Feet): 115 Feet Assistive device: Rolling walker (2 wheeled) Gait Pattern/deviations: Step-to pattern;Decreased stride length;Decreased stance time - left;Decreased weight shift to left Gait velocity: decreased   General Gait Details: < 25% VC's on proper walker to self distance and safety with turns   Stairs Stairs: Yes Stairs assistance: Supervision;Min guard Stair Management: No rails;Step to pattern;Forwards;With walker Number of Stairs: 1 General stair comments: one initial VC and pt performed safely/correctly   Wheelchair Mobility    Modified Rankin (Stroke Patients Only)       Balance                                            Cognition Arousal/Alertness: Awake/alert Behavior During Therapy: WFL for tasks assessed/performed Overall Cognitive Status: Within Functional Limits for tasks assessed                                 General Comments: pleasant      Exercises   Total Knee Replacement TE's following HEP handout 10 reps B LE ankle pumps 05 reps towel squeezes 05 reps knee presses 05 reps heel slides  05 reps SAQ's 05 reps SLR's 05 reps ABD Educated on use of gait belt to assist with TE's Followed by ICE     General Comments        Pertinent Vitals/Pain  Pain Assessment: 0-10 Pain Score: 3  Pain Location: Lt knee Pain Descriptors / Indicators: Aching;Sore Pain Intervention(s): Monitored during session;Repositioned;Ice applied    Home Living                      Prior Function            PT Goals (current goals can now be found in the care plan section) Progress towards PT goals: Progressing toward goals    Frequency    7X/week      PT Plan Current plan remains appropriate    Co-evaluation              AM-PAC PT "6 Clicks" Mobility   Outcome Measure  Help needed turning from your back to your  side while in a flat bed without using bedrails?: None Help needed moving from lying on your back to sitting on the side of a flat bed without using bedrails?: None Help needed moving to and from a bed to a chair (including a wheelchair)?: None Help needed standing up from a chair using your arms (e.g., wheelchair or bedside chair)?: A Little Help needed to walk in hospital room?: A Little Help needed climbing 3-5 steps with a railing? : A Little 6 Click Score: 21    End of Session Equipment Utilized During Treatment: Gait belt Activity Tolerance: Patient tolerated treatment well Patient left: in chair;with call bell/phone within reach;with chair alarm set;with family/visitor present Nurse Communication: Mobility status(pt has met goals to D/C to home after one session) PT Visit Diagnosis: Muscle weakness (generalized) (M62.81);Difficulty in walking, not elsewhere classified (R26.2)     Time: 6979-4801 PT Time Calculation (min) (ACUTE ONLY): 26 min  Charges:  $Gait Training: 8-22 mins $Therapeutic Exercise: 8-22 mins                     Rica Koyanagi  PTA Acute  Rehabilitation Services Pager      [phone] Office      [phone]

## 2020-02-08 NOTE — Progress Notes (Signed)
SPORTS MEDICINE AND JOINT REPLACEMENT  Lara Mulch, MD    Carlyon Shadow, PA-C [place], [place], [place]  [address]                             [phone]   PROGRESS NOTE  Subjective:  negative for Chest Pain  negative for Shortness of Breath  negative for Nausea/Vomiting   negative for Calf Pain  negative for Bowel Movement   Tolerating Diet: yes         Patient reports pain as 3 on 0-10 scale.    Objective: Vital signs in last 24 hours:    Patient Vitals for the past 24 hrs:  BP Temp Temp src Pulse Resp SpO2 Height Weight  02/08/20 0524 (!) 109/52 97.9 F (36.6 C) Axillary 63 18 100 % - -  02/08/20 0156 125/62 97.7 F (36.5 C) Axillary 74 16 100 % - -  02/07/20 2155 (!) 128/55 97.7 F (36.5 C) Oral 71 15 97 % - -  02/07/20 1852 (!) 152/66 (!) 97.4 F (36.3 C) Oral 74 16 99 % - -  02/07/20 1811 (!) 147/73 97.8 F (36.6 C) - 73 16 100 % - -  02/07/20 1649 (!) 144/68 97.6 F (36.4 C) - 66 16 100 % - -  02/07/20 1540 125/62 97.8 F (36.6 C) - 76 16 100 % - -  02/07/20 1530 (!) 125/57 98.5 F (36.9 C) - 84 16 100 % - -  02/07/20 1515 118/63 - - 79 14 99 % - -  02/07/20 1500 114/62 - - 72 10 100 % - -  02/07/20 1445 (!) 119/57 - - 75 17 99 % - -  02/07/20 1433 134/68 98.7 F (37.1 C) - 89 13 100 % - -  02/07/20 1038 - - - (!) 49 14 100 % - -  02/07/20 1037 116/60 - - (!) 50 12 100 % - -  02/07/20 1036 - - - (!) 51 18 100 % - -  02/07/20 1035 - - - (!) 51 11 100 % - -  02/07/20 1034 - - - (!) 50 14 100 % - -  02/07/20 1033 - - - (!) 50 14 100 % - -  02/07/20 1032 (!) 117/52 - - (!) 49 (!) 22 100 % - -  02/07/20 1031 - - - (!) 49 17 100 % - -  02/07/20 1030 - - - (!) 51 18 100 % - -  02/07/20 1029 - - - (!) 49 15 100 % - -  02/07/20 1028 - - - (!) 54 18 100 % - -  02/07/20 1027 113/68 - - (!) 55 16 100 % - -  02/07/20 1026 - - - (!) 56 18 100 % - -  02/07/20 1025 - - - (!) 56 20 100 % - -  02/07/20 1024 - - - (!) 52 20 100 % - -  02/07/20 1023 - -  - (!) 51 15 100 % - -  02/07/20 1022 (!) 137/57 - - (!) 56 17 100 % - -  02/07/20 1021 - - - (!) 55 16 100 % - -  02/07/20 1020 - - - - 14 - - -  02/07/20 1019 - - - - (!) 25 - - -  02/07/20 1018 - - - - 14 - - -  02/07/20 1017 134/60 - - (!) 46 17 93 % - -  02/07/20 [id number] - - - - - - 5' 4.5" (1.638 m) 55.9 kg  02/07/20 0914 (!) 164/51 97.8 F (36.6 C) Oral 62 16 99 % - -    @flow {1959:LAST@   Intake/Output from previous day:   03/29 0701 - 03/30 0700 In: 3726.2 [P.O.:600; I.V.:2626.2] Out: 1900 [Urine:1800]   Intake/Output this shift:   No intake/output data recorded.   Intake/Output      03/29 0701 - 03/30 0700 03/30 0701 - 03/31 0700   P.O. 600    I.V. (mL/kg) 2626.2 (47)    IV Piggyback 500    Total Intake(mL/kg) 3726.2 (66.7)    Urine (mL/kg/hr) 1800    Blood 100    Total Output 1900    Net +1826.2            LABORATORY DATA: Recent Labs    02/01/20 0956 02/08/20 0349  WBC 4.2 6.3  HGB 12.7 10.5*  HCT 40.5 33.0*  PLT 186 156   Recent Labs    02/01/20 0956 02/08/20 0349  NA 140 138  K 4.9 3.6  CL 105 105  CO2 28 27  BUN 19 19  CREATININE 0.62 0.59  GLUCOSE 104* 211*  CALCIUM 9.4 8.3*   Lab Results  Component Value Date   INR 0.96 12/13/2013   INR 0.94 12/31/2012    Examination:  General appearance: alert, cooperative and no distress Extremities: extremities normal, atraumatic, no cyanosis or edema  Wound Exam: clean, dry, intact   Drainage:  None: wound tissue dry  Motor Exam: Quadriceps and Hamstrings Intact  Sensory Exam: Superficial Peroneal, Deep Peroneal and Tibial normal   Assessment:    1 Day Post-Op  Procedure(s) (LRB): TOTAL KNEE REVISION (Left)  ADDITIONAL DIAGNOSIS:  Active Problems:   S/P revision of total knee     Plan: Physical Therapy as ordered Weight Bearing as Tolerated (WBAT)  DVT Prophylaxis:  Aspirin  DISCHARGE PLAN: Home  DISCHARGE NEEDS: HHPT       Patient's anticipated LOS is less than 2  midnights, meeting these requirements: - Lives within 1 hour of care - Has a competent adult at home to recover with post-op recover - NO history of  - Chronic pain requiring opiods  - Diabetes  - Coronary Artery Disease  - Heart failure  - Heart attack  - Stroke  - DVT/VTE  - Cardiac arrhythmia  - Respiratory Failure/COPD  - Renal failure  - Anemia  - Advanced Liver disease        Donia Ast 02/08/2020, 7:38 AM

## 2020-02-08 NOTE — Op Note (Signed)
TOTAL KNEE REPLACEMENT OPERATIVE NOTE:  02/07/2020  1:00 PM  PATIENT:  Anne Pearson  79 y.o. female  PRE-OPERATIVE DIAGNOSIS:  Failed Total knee arthroplasty  POST-OPERATIVE DIAGNOSIS:  Failed Total knee arthroplasty  PROCEDURE:  Procedure(s): TOTAL KNEE REVISION  SURGEON:  Surgeon(s): Vickey Huger, MD  PHYSICIAN ASSISTANT:  Nehemiah Massed, PA-C  ANESTHESIA:   spinal  SPECIMEN: None  COUNTS:  Correct  TOURNIQUET:   Total Tourniquet Time Documented: Thigh (Left) - 103 minutes Total: Thigh (Left) - 103 minutes   DICTATION:  Indication for procedure:    The patient is a 79 y.o. female who has failed conservative treatment for Failed Total knee arthroplasty.  Informed consent was obtained prior to anesthesia. The risks versus benefits of the operation were explain and in a way the patient can, and did, understand.    Description of procedure:   The patient was taken to the operating room and placed under anesthesia. The patient was positioned in the usual fashion taking care that all body parts were adequately padded and/or protected. A tourniquet was applied and the leg prepped and draped in the usual sterile fashion. The extremity was exsanguinated with the esmarch and tourniquet inflated to 300 mmHg. Pre-operative range of motion was -5 to 90 degrees.  A midline incision approximately 6-7 inches long was made with a #10 blade. A new blade was used to make a parapatellar arthrotomy going 2-3 cm into the quadriceps tendon, over the patella, and alongside the medial aspect of the patellar tendon. A synovectomy was then performed with the #10 blade and forceps. I then elevated the deep MCL off the medial tibial metaphysis subperiosteally around to the semimembranosus attachment.    I everted the patella anddebrided it and found no problem with the prior replacement.   A homan retractor was place to retract and protect the patella and lateral  structures. A Z-retractor was placed medially to protect the medial structures. The femoral component was removed using a small mallet micro-e saw, and slap hammer with no issues. I then used a large rongeur and curette to remove cement and debride down to bone. There was mild bone loss on the medial and lateral distal femur.I used a starter reamer on power to ream the femoral canal. I then reamed by hand up to a 15 mm by 136mm length stem and it fit well. A size3 femur with33mmbilateral distal augments fit well.  The tibia was delivered forward in deep flexion and external rotation.I used a micro-e sagital saw, revision osteotome and a bone tamp to remove the tibial component. Cement was removed with a quarter inch osteotome and a harrison rongeur. A starter reamer on power was used to ream to tibial canal. I reamed by hand up to a 10mm by 132mm length stem that fit well.A sizeBtray was selected and pinned into place centered on the medial 1/3 of the tibial tubercle. The reamer and keel was used to prepare the tibia through the tray. No Augments were needed.  A lamina spreader was placed in 90 degrees of flexion. A 72mm spacer blocked was found to offer good flexion and extension gap balance after minimal in degreereleasing.    I then trialed with the size72femur, sizeBtibia, a 62mm insert. I had excellent flexion/extension gap balance, excellent patella tracking. Flexion was full and beyond 100 degrees; extension was zero. These components were chosen and the staff opened them to me on the back table while the knee was lavaged copiously and the cement mixed.  The soft tissue was infiltrated with 60cc of exparel 1.3% through a 21 gauge needle.  I cemented in the components and removed all excess cement. The polyethylene tibial component was snapped into place and the knee placed in extension while cement was hardening. The capsule was infilltrated with a 60cc  exparel/marcaine/saline mixture. Once the cement was hard, the tourniquet was let down. Hemostasis was obtained. The arthrotomy was closed using a #1 stratofix running suture. The deep soft tissues were closed with #0 vicryls and the subcuticular layer closed with #2-0 vicryl. The skin was reapproximated and closed with 3.0 Monocryl. The wound was covered with steristrips, aquacel dressing, and a TED stocking. The patient was then awakened, extubated, and taken to the recovery room in stable condition.  BLOOD LOSS:  0000000 COMPLICATIONS:  None.  PLAN OF CARE: Admit for overnight observation  PATIENT DISPOSITION:  PACU - hemodynamically stable.   Delay start of Pharmacological VTE agent (>24hrs) due to surgical blood loss or risk of bleeding:  yes  Please fax a copy of this op note to my office at [phone] (please only include page 1 and 2 of the Case Information op note)

## 2020-02-08 NOTE — Plan of Care (Signed)
Patient discharged home in stable condition 

## 2020-02-10 ENCOUNTER — Encounter: Payer: Self-pay | Admitting: *Deleted

## 2021-06-26 NOTE — H&P (Signed)
TOTAL HIP ADMISSION H&P  Patient is admitted for left total hip arthroplasty.  Subjective:  Chief Complaint: Left hip pain  HPI: Anne Pearson, 80 y.o. female, has a history of pain and functional disability in the left hip due to arthritis and patient has failed non-surgical conservative treatments for greater than 12 weeks to include corticosteriod injections and activity modification. Onset of symptoms was gradual, starting  several  years ago with gradually worsening course since that time. The patient noted no past surgery on the left hip. Patient currently rates pain in the left hip at 7 out of 10 with activity. Patient has night pain, worsening of pain with activity and weight bearing, pain that interfers with activities of daily living, and crepitus. Patient has evidence of  bone-on-bone arthritis with large subchondral cysts  by imaging studies. This condition presents safety issues increasing the risk of falls. There is no current active infection.  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   Left knee pain 12/20/2013    Past Medical History:  Diagnosis Date   Basal cell carcinoma    face   Cataract    Small   DDD (degenerative disc disease), cervical    GAD (generalized anxiety disorder)    GERD (gastroesophageal reflux disease)    Hepatitis    Drug induced   History of carpal tunnel release    Left    History of hiatal hernia    History of iron deficiency anemia    in childhood   History of multinodular goiter    Hypercholesterolemia    Hypertension    Hypothyroidism    IBS (irritable bowel syndrome)    OA (osteoarthritis)    Pneumonia    childhood   PONV (postoperative nausea and vomiting)    Restless leg syndrome    S/P radioactive iodine thyroid ablation    Tachycardia     Past Surgical History:  Procedure Laterality Date   ABDOMINAL HYSTERECTOMY  1988   APPENDECTOMY     BLADDER SUSPENSION  2011    BREAST LUMPECTOMY Left    CARPAL TUNNEL RELEASE Left    COLONOSCOPY     COLONOSCOPY     I & D KNEE WITH POLY EXCHANGE Left 12/20/2013   Procedure: IRRIGATION AND DEBRIDEMENT KNEE WITH POLY EXCHANGE;  Surgeon: Vickey Huger, MD;  Location: [facility];  Service: Orthopedics;  Laterality: Left;   INGUINAL HERNIA REPAIR Bilateral    JOINT REPLACEMENT Left 2012   PARTIAL KNEE ARTHROPLASTY Right 01/04/2013   Procedure: PARTIAL KNEE REPLACEMENT/UNICOMPARTMENTAL MEDIAL;  Surgeon: Vickey Huger, MD;  Location: [facility];  Service: Orthopedics;  Laterality: Right;   TONSILLECTOMY AND ADENOIDECTOMY  1947   TOTAL HIP ARTHROPLASTY Right    TOTAL KNEE ARTHROPLASTY Left    TOTAL KNEE REVISION Left 02/07/2020   Procedure: TOTAL KNEE REVISION;  Surgeon: Vickey Huger, MD;  Location: [facility];  Service: Orthopedics;  Laterality: Left;   UPPER GI ENDOSCOPY      Prior to Admission medications   Medication Sig Start Date End Date Taking? Authorizing Provider  acetaminophen (TYLENOL) 650 MG CR tablet Take 650-1,300 mg by mouth every 8 (eight) hours as needed (knee pain.).    [provider]  ALPRAZolam Duanne Moron) 1 MG tablet Take 1 mg by mouth at bedtime as needed and may repeat dose one time if needed. 01/14/20   [provider]  Ascorbic Acid (VITAMIN C WITH ROSE HIPS) 500 MG  tablet Take 500 mg by mouth daily.    [provider]  aspirin EC 325 MG EC tablet Take 1 tablet (325 mg total) by mouth 2 (two) times daily. 02/08/20   Donia Ast, PA  Biotin 5000 MCG TABS Take 5,000 mcg by mouth daily.    [provider]  calcium citrate-vitamin D 500-400 MG-UNIT chewable tablet Chew 1 tablet by mouth daily.    [provider]  cholecalciferol (VITAMIN D3) 25 MCG (1000 UNIT) tablet Take 1,000 Units by mouth daily.    [provider]  fluticasone (FLONASE) 50 MCG/ACT nasal spray Place 2 sprays into both nostrils at bedtime.    [provider]  hydrocortisone 2.5 % lotion  Apply 1 application topically 2 (two) times daily as needed (Granuloma annulare).  12/07/19   [provider]  lansoprazole (PREVACID) 15 MG capsule Take 15 mg by mouth every evening.     [provider]  levothyroxine (SYNTHROID) 88 MCG tablet Take 88 mcg by mouth daily before breakfast. 01/04/20   [provider]  lisinopril (PRINIVIL,ZESTRIL) 10 MG tablet Take 10 mg by mouth daily.    [provider]  loratadine (CLARITIN) 10 MG tablet Take 10 mg by mouth daily. [place] Geographical information systems officer, Historical, MD  methocarbamol (ROBAXIN) 500 MG tablet Take 1-2 tablets (500-1,000 mg total) by mouth every 6 (six) hours as needed for muscle spasms. 02/08/20   Donia Ast, PA  metoprolol tartrate (LOPRESSOR) 25 MG tablet Take 25 mg by mouth daily. 01/19/20   [provider]  metroNIDAZOLE (METROCREAM) 0.75 % cream Apply 1 application topically daily. Applied to nose & under eyes    [provider]  oxyCODONE (OXY IR/ROXICODONE) 5 MG immediate release tablet Take 1-2 tablets (5-10 mg total) by mouth every 6 (six) hours as needed for moderate pain (pain score 4-6). 02/08/20   Donia Ast, PA  pramipexole (MIRAPEX) 0.25 MG tablet Take 0.5 mg by mouth at bedtime. 12/07/19   [provider]  rosuvastatin (CRESTOR) 5 MG tablet Take 5 mg by mouth at bedtime. 12/07/19   [provider]  triamcinolone cream (KENALOG) 0.1 % Apply 1 application topically 2 (two) times daily as needed (Granuloma annulare).  01/13/20   [provider]    Allergies  Allergen Reactions   Macrodantin [Nitrofurantoin] Other (See Comments)    Caused hepatitis    Codeine Nausea And Vomiting   Other     Red meat    Meloxicam Rash    Social History   Socioeconomic History   Marital status: Married    Spouse name: Not on file   Number of children: Not on file   Years of education: Not on file   Highest education level: Not on file   Occupational History   Not on file  Tobacco Use   Smoking status: Former    Packs/day: 1.00    Years: 30.00    Pack years: 30.00    Types: Cigarettes    Quit date: 11/11/1997    Years since quitting: 23.6   Smokeless tobacco: Never  Vaping Use   Vaping Use: Never used  Substance and Sexual Activity   Alcohol use: Yes    Comment: social   Drug use: No   Sexual activity: Not on file  Other Topics Concern   Not on file  Social History Narrative   Not on file   Social Determinants of Health   Financial Resource Strain:  Not on file  Food Insecurity: Not on file  Transportation Needs: Not on file  Physical Activity: Not on file  Stress: Not on file  Social Connections: Not on file  Intimate Partner Violence: Not on file    Tobacco Use: Medium Risk   Smoking Tobacco Use: Former   Smokeless Tobacco Use: Never   Social History   Substance and Sexual Activity  Alcohol Use Yes   Comment: social    No family history on file.  Review of Systems  Constitutional:  Negative for chills and fever.  HENT:  Negative for congestion, sore throat and tinnitus.   Eyes:  Negative for double vision, photophobia and pain.  Respiratory:  Negative for cough, shortness of breath and wheezing.   Cardiovascular:  Negative for chest pain, palpitations and orthopnea.  Gastrointestinal:  Negative for heartburn, nausea and vomiting.  Genitourinary:  Negative for dysuria, frequency and urgency.  Musculoskeletal:  Positive for joint pain.  Neurological:  Negative for dizziness, weakness and headaches.    Objective:  Physical Exam: Well nourished and well developed.  General: Alert and oriented x3, cooperative and pleasant, no acute distress.  Head: normocephalic, atraumatic, neck supple.  Eyes: EOMI.  Respiratory: breath sounds clear in all fields, no wheezing, rales, or rhonchi. Cardiovascular: Regular rate and rhythm, no murmurs, gallops or rubs.  Abdomen: non-tender to palpation and  soft, normoactive bowel sounds. Musculoskeletal:  Left Hip Exam:  The range of motion: Flexion to 100 degrees, Internal Rotation is minimal, External Rotation to 30 degrees, and abduction to 30 degrees without discomfort.  There is no tenderness over the greater trochanteric bursa   Calves soft and nontender. Motor function intact in LE. Strength 5/5 LE bilaterally. Neuro: Distal pulses 2+. Sensation to light touch intact in LE.  Imaging Review Plain radiographs demonstrate severe degenerative joint disease of the left hip. The bone quality appears to be adequate for age and reported activity level.  Assessment/Plan:  End stage arthritis, left hip  The patient history, physical examination, clinical judgement of the provider and imaging studies are consistent with end stage degenerative joint disease of the left hip and total hip arthroplasty is deemed medically necessary. The treatment options including medical management, injection therapy, arthroscopy and arthroplasty were discussed at length. The risks and benefits of total hip arthroplasty were presented and reviewed. The risks due to aseptic loosening, infection, stiffness, dislocation/subluxation, thromboembolic complications and other imponderables were discussed. The patient acknowledged the explanation, agreed to proceed with the plan and consent was signed. Patient is being admitted for inpatient treatment for surgery, pain control, PT, OT, prophylactic antibiotics, VTE prophylaxis, progressive ambulation and ADLs and discharge planning.The patient is planning to be discharged  home .   Patient's anticipated LOS is less than 2 midnights, meeting these requirements: - Younger than 72 - Lives within 1 hour of care - Has a competent adult at home to recover with post-op recover - NO history of  - Chronic pain requiring opiods  - Diabetes  - Coronary Artery Disease  - Heart failure  - Heart attack  - Stroke  - DVT/VTE  -  Cardiac arrhythmia  - Respiratory Failure/COPD  - Renal failure  - Anemia  - Advanced Liver disease  Therapy Plans: HEP Disposition: Home with son and husband Planned DVT Prophylaxis: Aspirin 325 mg BID DME Needed: None PCP: Joaquim Lai, MD (clearance received) Cardiologist: Laymond Purser, MD (clearance received) TXA: IV Allergies: Codeine (vomiting), nitrofurantoin Anesthesia Concerns: None BMI: 24.3 Last HgbA1c:  Not diabetic  [facility]  Other: - Has tolerated hydrocodone in the past  - Patient was instructed on what medications to stop prior to surgery. - Follow-up visit in 2 weeks with Dr. Wynelle Link - Begin physical therapy following surgery - Pre-operative lab work as pre-surgical testing - Prescriptions will be provided in hospital at time of discharge  Theresa Duty, PA-C Orthopedic Surgery [facility] [place]

## 2021-07-03 NOTE — Progress Notes (Signed)
DUE TO COVID-19 ONLY ONE VISITOR IS ALLOWED TO COME WITH YOU AND STAY IN THE WAITING ROOM ONLY DURING PRE OP AND PROCEDURE DAY OF SURGERY.  2 VISITOR  MAY VISIT WITH YOU AFTER SURGERY IN YOUR PRIVATE ROOM DURING VISITING HOURS ONLY!  YOU NEED TO HAVE A COVID 19 TEST ON___9/04/2021 ___'@_'$  '@_from'$  8am-3pm _____, THIS TEST MUST BE DONE BEFORE SURGERY,  Covid test is done at [place], [place] [address].  This is a drive thru.  No appt required. Please see map.                 Your procedure is scheduled on:  07/18/2021   Report to [facility] Main  Entrance   Report to admitting at    0730AM     Call this number if you have problems the morning of surgery [phone]    REMEMBER: NO  SOLID FOOD CANDY OR GUM AFTER MIDNIGHT. CLEAR LIQUIDS UNTIL    0710am        . NOTHING BY MOUTH EXCEPT CLEAR LIQUIDS UNTIL      0710am  . PLEASE FINISH ENSURE DRINK PER SURGEON ORDER  WHICH NEEDS TO BE COMPLETED AT    0710am   .      CLEAR LIQUID DIET   Foods Allowed                                                                    Coffee and tea, regular and decaf                            Fruit ices (not with fruit pulp)                                      Iced Popsicles                                    Carbonated beverages, regular and diet                                    Cranberry, grape and apple juices Sports drinks like Gatorade Lightly seasoned clear broth or consume(fat free) Sugar, honey syrup ___________________________________________________________________      BRUSH YOUR TEETH MORNING OF SURGERY AND RINSE YOUR MOUTH OUT, NO CHEWING GUM CANDY OR MINTS.     Take these medicines the morning of surgery with A SIP OF WATER: pepcid, synthroid, claritin, metoprolol   DO NOT TAKE ANY DIABETIC MEDICATIONS DAY OF YOUR SURGERY                               You may not have any metal on your body including hair pins and              piercings  Do not wear jewelry,  make-up, lotions, powders or perfumes, deodorant  Do not wear nail polish on your fingernails.  Do not shave  48 hours prior to surgery.              Men may shave face and neck.   Do not bring valuables to the hospital. [place].  Contacts, dentures or bridgework may not be worn into surgery.  Leave suitcase in the car. After surgery it may be brought to your room.     Patients discharged the day of surgery will not be allowed to drive home. IF YOU ARE HAVING SURGERY AND GOING HOME THE SAME DAY, YOU MUST HAVE AN ADULT TO DRIVE YOU HOME AND BE WITH YOU FOR 24 HOURS. YOU MAY GO HOME BY TAXI OR UBER OR ORTHERWISE, BUT AN ADULT MUST ACCOMPANY YOU HOME AND STAY WITH YOU FOR 24 HOURS.  Name and phone number of your driver:  Special Instructions: N/A              Please read over the following fact sheets you were given: _____________________________________________________________________  [facility] - Preparing for Surgery Before surgery, you can play an important role.  Because skin is not sterile, your skin needs to be as free of germs as possible.  You can reduce the number of germs on your skin by washing with CHG (chlorahexidine gluconate) soap before surgery.  CHG is an antiseptic cleaner which kills germs and bonds with the skin to continue killing germs even after washing. Please DO NOT use if you have an allergy to CHG or antibacterial soaps.  If your skin becomes reddened/irritated stop using the CHG and inform your nurse when you arrive at Short Stay. Do not shave (including legs and underarms) for at least 48 hours prior to the first CHG shower.  You may shave your face/neck. Please follow these instructions carefully:  1.  Shower with CHG Soap the night before surgery and the  morning of Surgery.  2.  If you choose to wash your hair, wash your hair first as usual with your  normal  shampoo.  3.  After you shampoo, rinse  your hair and body thoroughly to remove the  shampoo.                           4.  Use CHG as you would any other liquid soap.  You can apply chg directly  to the skin and wash                       Gently with a scrungie or clean washcloth.  5.  Apply the CHG Soap to your body ONLY FROM THE NECK DOWN.   Do not use on face/ open                           Wound or open sores. Avoid contact with eyes, ears mouth and genitals (private parts).                       Wash face,  Genitals (private parts) with your normal soap.             6.  Wash thoroughly, paying special attention to the area where your surgery  will be performed.  7.  Thoroughly rinse your body with  warm water from the neck down.  8.  DO NOT shower/wash with your normal soap after using and rinsing off  the CHG Soap.                9.  Pat yourself dry with a clean towel.            10.  Wear clean pajamas.            11.  Place clean sheets on your bed the night of your first shower and do not  sleep with pets. Day of Surgery : Do not apply any lotions/deodorants the morning of surgery.  Please wear clean clothes to the hospital/surgery center.  FAILURE TO FOLLOW THESE INSTRUCTIONS MAY RESULT IN THE CANCELLATION OF YOUR SURGERY PATIENT SIGNATURE_________________________________  NURSE SIGNATURE__________________________________  ________________________________________________________________________

## 2021-07-06 ENCOUNTER — Other Ambulatory Visit: Payer: Self-pay

## 2021-07-06 ENCOUNTER — Encounter (HOSPITAL_COMMUNITY)
Admission: RE | Admit: 2021-07-06 | Discharge: 2021-07-06 | Disposition: A | Discharge status: Home / Self Care | Payer: Medicare Other | Source: Ambulatory Visit | Attending: Orthopedic Surgery | Admitting: Orthopedic Surgery

## 2021-07-06 ENCOUNTER — Encounter (HOSPITAL_COMMUNITY): Payer: Self-pay

## 2021-07-06 DIAGNOSIS — Z01818 Encounter for other preprocedural examination: Secondary | ICD-10-CM | POA: Insufficient documentation

## 2021-07-06 LAB — CBC
HCT: 39.5 % (ref 36.0–46.0)
Hemoglobin: 12.6 g/dL (ref 12.0–15.0)
MCH: 29.6 pg (ref 26.0–34.0)
MCHC: 31.9 g/dL (ref 30.0–36.0)
MCV: 92.7 fL (ref 80.0–100.0)
Platelets: 181 10*3/uL (ref 150–400)
RBC: 4.26 MIL/uL (ref 3.87–5.11)
RDW: 12.6 % (ref 11.5–15.5)
WBC: 4.4 10*3/uL (ref 4.0–10.5)
nRBC: 0 % (ref 0.0–0.2)

## 2021-07-06 LAB — COMPREHENSIVE METABOLIC PANEL
ALT: 18 U/L (ref 0–44)
AST: 23 U/L (ref 15–41)
Albumin: 4.2 g/dL (ref 3.5–5.0)
Alkaline Phosphatase: 70 U/L (ref 38–126)
Anion gap: 7 (ref 5–15)
BUN: 24 mg/dL — ABNORMAL HIGH (ref 8–23)
CO2: 27 mmol/L (ref 22–32)
Calcium: 9 mg/dL (ref 8.9–10.3)
Chloride: 102 mmol/L (ref 98–111)
Creatinine, Ser: 0.55 mg/dL (ref 0.44–1.00)
GFR, Estimated: >60 mL/min (ref >60)
Glucose, Bld: 90 mg/dL (ref 70–99)
Potassium: 4.4 mmol/L (ref 3.5–5.1)
Sodium: 136 mmol/L (ref 135–145)
Total Bilirubin: 1.2 mg/dL (ref 0.3–1.2)
Total Protein: 7 g/dL (ref 6.5–8.1)

## 2021-07-06 LAB — TYPE AND SCREEN
ABO/RH(D): O POS
Antibody Screen: NEGATIVE

## 2021-07-06 LAB — SURGICAL PCR SCREEN
MRSA, PCR: NEGATIVE
Staphylococcus aureus: NEGATIVE

## 2021-07-06 NOTE — Progress Notes (Addendum)
Anesthesia Review:  PCP: Dr Wynelle Cleveland [facility]  Clearance by Andre Lefort on 05/03/21 before she left practice  clearance on chart  Cardiologist : DR Lucia Gaskins- [place] 05/01/21 on chart  Chest x-ray : EKG : 07/06/21.   Echo : Stress test: Cardiac Cath :  Activity level: can do a flight of stairs without difficulty  Sleep Study/ CPAP : none  Fasting Blood Sugar :      / Checks Blood Sugar -- times a day:   Blood Thinner/ Instructions /Last Dose: ASA / Instructions/ Last Dose :  Positive for covid on 05/16/21- have requested results and office visit note  they are to fax.   Results on front of chart.  Pulse 44 on ekg and 50 by dynamapp.  Pt asymptomatic. Anne Pearson made aware.  No new orders given.

## 2021-07-17 NOTE — Anesthesia Preprocedure Evaluation (Addendum)
Anesthesia Evaluation  Patient identified by MRN, date of birth, ID band Patient awake    Reviewed: Allergy & Precautions, NPO status , Patient's Chart, lab work & pertinent test results  Airway Mallampati: II  TM Distance: >3 FB Neck ROM: Full    Dental no notable dental hx.    Pulmonary former smoker,    Pulmonary exam normal breath sounds clear to auscultation       Cardiovascular hypertension, Pt. on medications and Pt. on home beta blockers Normal cardiovascular exam Rhythm:Regular Rate:Normal  ECG: SB, rate 44   Neuro/Psych negative neurological ROS  negative psych ROS   GI/Hepatic Neg liver ROS, GERD  Medicated and Controlled,IBS (irritable bowel syndrome)   Endo/Other  Hypothyroidism   Renal/GU negative Renal ROS     Musculoskeletal  (+) Arthritis ,   Abdominal   Peds  Hematology HLD   Anesthesia Other Findings left hip osteoarthritis  Reproductive/Obstetrics                            Anesthesia Physical Anesthesia Plan  ASA: 2  Anesthesia Plan: Spinal   Post-op Pain Management:    Induction: Intravenous  PONV Risk Score and Plan: 2 and Ondansetron, Dexamethasone, Propofol infusion and Treatment may vary due to age or medical condition  Airway Management Planned: Simple Face Mask  Additional Equipment:   Intra-op Plan:   Post-operative Plan:   Informed Consent: I have reviewed the patients History and Physical, chart, labs and discussed the procedure including the risks, benefits and alternatives for the proposed anesthesia with the patient or authorized representative who has indicated his/her understanding and acceptance.     Dental advisory given  Plan Discussed with: CRNA  Anesthesia Plan Comments:         Anesthesia Quick Evaluation

## 2021-07-18 ENCOUNTER — Observation Stay (HOSPITAL_COMMUNITY): Payer: Medicare Other

## 2021-07-18 ENCOUNTER — Other Ambulatory Visit: Payer: Self-pay

## 2021-07-18 ENCOUNTER — Encounter (HOSPITAL_COMMUNITY)
Admission: RE | Disposition: A | Discharge status: Home / Self Care | Payer: Self-pay | Source: Home / Self Care | Attending: Orthopedic Surgery

## 2021-07-18 ENCOUNTER — Encounter (HOSPITAL_COMMUNITY): Payer: Self-pay | Admitting: Orthopedic Surgery

## 2021-07-18 ENCOUNTER — Ambulatory Visit (HOSPITAL_COMMUNITY): Payer: Medicare Other | Admitting: Physician Assistant

## 2021-07-18 ENCOUNTER — Observation Stay (HOSPITAL_COMMUNITY)
Admission: RE | Admit: 2021-07-18 | Discharge: 2021-07-19 | Disposition: A | Discharge status: Home / Self Care | Payer: Medicare Other | Attending: Orthopedic Surgery | Admitting: Orthopedic Surgery

## 2021-07-18 ENCOUNTER — Ambulatory Visit (HOSPITAL_COMMUNITY): Payer: Medicare Other

## 2021-07-18 ENCOUNTER — Ambulatory Visit (HOSPITAL_COMMUNITY): Payer: Medicare Other | Admitting: Anesthesiology

## 2021-07-18 DIAGNOSIS — Z79899 Other long term (current) drug therapy: Secondary | ICD-10-CM | POA: Diagnosis not present

## 2021-07-18 DIAGNOSIS — Z96641 Presence of right artificial hip joint: Secondary | ICD-10-CM | POA: Diagnosis not present

## 2021-07-18 DIAGNOSIS — Z96642 Presence of left artificial hip joint: Secondary | ICD-10-CM | POA: Diagnosis present

## 2021-07-18 DIAGNOSIS — Z85828 Personal history of other malignant neoplasm of skin: Secondary | ICD-10-CM | POA: Diagnosis not present

## 2021-07-18 DIAGNOSIS — I10 Essential (primary) hypertension: Secondary | ICD-10-CM | POA: Insufficient documentation

## 2021-07-18 DIAGNOSIS — Z96653 Presence of artificial knee joint, bilateral: Secondary | ICD-10-CM | POA: Diagnosis not present

## 2021-07-18 DIAGNOSIS — M1612 Unilateral primary osteoarthritis, left hip: Secondary | ICD-10-CM | POA: Diagnosis present

## 2021-07-18 DIAGNOSIS — M169 Osteoarthritis of hip, unspecified: Secondary | ICD-10-CM | POA: Diagnosis present

## 2021-07-18 DIAGNOSIS — E039 Hypothyroidism, unspecified: Secondary | ICD-10-CM | POA: Insufficient documentation

## 2021-07-18 DIAGNOSIS — Z96649 Presence of unspecified artificial hip joint: Secondary | ICD-10-CM

## 2021-07-18 DIAGNOSIS — Z87891 Personal history of nicotine dependence: Secondary | ICD-10-CM | POA: Diagnosis not present

## 2021-07-18 HISTORY — PX: TOTAL HIP ARTHROPLASTY: SHX124

## 2021-07-18 SURGERY — ARTHROPLASTY, HIP, TOTAL, ANTERIOR APPROACH
Anesthesia: Spinal | Site: Hip | Laterality: Left

## 2021-07-18 MED ORDER — BUPIVACAINE HCL (PF) 0.25 % IJ SOLN
INTRAMUSCULAR | Status: AC
  Filled 2021-07-18: qty 30

## 2021-07-18 MED ORDER — HYDROCODONE-ACETAMINOPHEN 5-325 MG PO TABS
1.0000 | ORAL_TABLET | ORAL | Status: DC | PRN
  Administered 2021-07-19: 2 via ORAL
  Filled 2021-07-18: qty 2
  Filled 2021-07-18: qty 1

## 2021-07-18 MED ORDER — FLUTICASONE PROPIONATE 50 MCG/ACT NA SUSP
2.0000 | Freq: Every day | NASAL | Status: DC
  Administered 2021-07-18: 2 via NASAL
  Filled 2021-07-18: qty 16

## 2021-07-18 MED ORDER — METOCLOPRAMIDE HCL 5 MG PO TABS: 5.0000 mg | ORAL_TABLET | Freq: Three times a day (TID) | ORAL | Status: DC | PRN

## 2021-07-18 MED ORDER — METOPROLOL TARTRATE 25 MG PO TABS: 25.0000 mg | ORAL_TABLET | Freq: Every day | ORAL | Status: DC

## 2021-07-18 MED ORDER — METHOCARBAMOL 500 MG PO TABS
500.0000 mg | ORAL_TABLET | Freq: Four times a day (QID) | ORAL | Status: DC | PRN
  Administered 2021-07-18: 500 mg via ORAL
  Filled 2021-07-18: qty 1

## 2021-07-18 MED ORDER — FENTANYL CITRATE PF 50 MCG/ML IJ SOSY: 25.0000 ug | PREFILLED_SYRINGE | INTRAMUSCULAR | Status: DC | PRN

## 2021-07-18 MED ORDER — POLYETHYLENE GLYCOL 3350 17 G PO PACK: 17.0000 g | PACK | Freq: Every day | ORAL | Status: DC | PRN

## 2021-07-18 MED ORDER — METOCLOPRAMIDE HCL 5 MG/ML IJ SOLN: 5.0000 mg | Freq: Three times a day (TID) | INTRAMUSCULAR | Status: DC | PRN

## 2021-07-18 MED ORDER — FENTANYL CITRATE (PF) 100 MCG/2ML IJ SOLN
INTRAMUSCULAR | Status: AC
  Filled 2021-07-18: qty 2

## 2021-07-18 MED ORDER — WATER FOR IRRIGATION, STERILE IR SOLN
Status: DC | PRN
  Administered 2021-07-18: 2000 mL

## 2021-07-18 MED ORDER — 0.9 % SODIUM CHLORIDE (POUR BTL) OPTIME
TOPICAL | Status: DC | PRN
  Administered 2021-07-18: 1000 mL

## 2021-07-18 MED ORDER — BUPIVACAINE-EPINEPHRINE (PF) 0.25% -1:200000 IJ SOLN
INTRAMUSCULAR | Status: AC
  Filled 2021-07-18: qty 30

## 2021-07-18 MED ORDER — CEFAZOLIN SODIUM-DEXTROSE 2-4 GM/100ML-% IV SOLN
2.0000 g | INTRAVENOUS | Status: AC
  Administered 2021-07-18: 2 g via INTRAVENOUS
  Filled 2021-07-18: qty 100

## 2021-07-18 MED ORDER — HYDROCODONE-ACETAMINOPHEN 7.5-325 MG PO TABS
1.0000 | ORAL_TABLET | ORAL | Status: DC | PRN
  Administered 2021-07-18: 2 via ORAL
  Filled 2021-07-18: qty 2

## 2021-07-18 MED ORDER — ONDANSETRON HCL 4 MG PO TABS: 4.0000 mg | ORAL_TABLET | Freq: Four times a day (QID) | ORAL | Status: DC | PRN

## 2021-07-18 MED ORDER — ONDANSETRON HCL 4 MG/2ML IJ SOLN: 4.0000 mg | Freq: Four times a day (QID) | INTRAMUSCULAR | Status: DC | PRN

## 2021-07-18 MED ORDER — FENTANYL CITRATE (PF) 100 MCG/2ML IJ SOLN
INTRAMUSCULAR | Status: DC | PRN
  Administered 2021-07-18 (×3): 25 ug via INTRAVENOUS

## 2021-07-18 MED ORDER — CHLORHEXIDINE GLUCONATE 0.12 % MT SOLN
15.0000 mL | Freq: Once | OROMUCOSAL | Status: AC
  Administered 2021-07-18: 15 mL via OROMUCOSAL

## 2021-07-18 MED ORDER — LACTATED RINGERS IV SOLN: INTRAVENOUS | Status: DC

## 2021-07-18 MED ORDER — PANTOPRAZOLE SODIUM 20 MG PO TBEC
20.0000 mg | DELAYED_RELEASE_TABLET | Freq: Every day | ORAL | Status: DC
  Administered 2021-07-19: 20 mg via ORAL
  Filled 2021-07-18 (×2): qty 1

## 2021-07-18 MED ORDER — ONDANSETRON HCL 4 MG/2ML IJ SOLN
INTRAMUSCULAR | Status: DC | PRN
  Administered 2021-07-18: 4 mg via INTRAVENOUS

## 2021-07-18 MED ORDER — CHLORHEXIDINE GLUCONATE CLOTH 2 % EX PADS: 6.0000 | MEDICATED_PAD | Freq: Every day | CUTANEOUS | Status: DC

## 2021-07-18 MED ORDER — ACETAMINOPHEN 10 MG/ML IV SOLN
1000.0000 mg | Freq: Four times a day (QID) | INTRAVENOUS | Status: AC
  Administered 2021-07-18: 1000 mg via INTRAVENOUS
  Filled 2021-07-18 (×3): qty 100

## 2021-07-18 MED ORDER — TRANEXAMIC ACID-NACL 1000-0.7 MG/100ML-% IV SOLN
1000.0000 mg | INTRAVENOUS | Status: AC
  Administered 2021-07-18: 1000 mg via INTRAVENOUS
  Filled 2021-07-18: qty 100

## 2021-07-18 MED ORDER — PHENOL 1.4 % MT LIQD: 1.0000 | OROMUCOSAL | Status: DC | PRN

## 2021-07-18 MED ORDER — DEXAMETHASONE SODIUM PHOSPHATE 10 MG/ML IJ SOLN
10.0000 mg | Freq: Once | INTRAMUSCULAR | Status: AC
  Administered 2021-07-19: 10 mg via INTRAVENOUS
  Filled 2021-07-18: qty 1

## 2021-07-18 MED ORDER — FAMOTIDINE 20 MG PO TABS
20.0000 mg | ORAL_TABLET | Freq: Every day | ORAL | Status: DC
  Administered 2021-07-18 – 2021-07-19 (×2): 20 mg via ORAL
  Filled 2021-07-18 (×2): qty 1

## 2021-07-18 MED ORDER — ONDANSETRON HCL 4 MG/2ML IJ SOLN: 4.0000 mg | Freq: Once | INTRAMUSCULAR | Status: DC | PRN

## 2021-07-18 MED ORDER — CHOLESTYRAMINE 4 G PO PACK
4.0000 g | PACK | Freq: Every day | ORAL | Status: DC | PRN
  Filled 2021-07-18: qty 1

## 2021-07-18 MED ORDER — SODIUM CHLORIDE 0.9 % IV SOLN: INTRAVENOUS | Status: DC

## 2021-07-18 MED ORDER — PROPOFOL 10 MG/ML IV BOLUS
INTRAVENOUS | Status: DC | PRN
  Administered 2021-07-18: 40 mg via INTRAVENOUS
  Administered 2021-07-18 (×2): 20 mg via INTRAVENOUS

## 2021-07-18 MED ORDER — TRAMADOL HCL 50 MG PO TABS: 50.0000 mg | ORAL_TABLET | Freq: Four times a day (QID) | ORAL | Status: DC | PRN

## 2021-07-18 MED ORDER — DEXAMETHASONE SODIUM PHOSPHATE 10 MG/ML IJ SOLN: 8.0000 mg | Freq: Once | INTRAMUSCULAR | Status: DC

## 2021-07-18 MED ORDER — LORATADINE 10 MG PO TABS
10.0000 mg | ORAL_TABLET | Freq: Every day | ORAL | Status: DC
  Administered 2021-07-19: 10 mg via ORAL
  Filled 2021-07-18 (×2): qty 1

## 2021-07-18 MED ORDER — AMISULPRIDE (ANTIEMETIC) 5 MG/2ML IV SOLN: 10.0000 mg | Freq: Once | INTRAVENOUS | Status: DC | PRN

## 2021-07-18 MED ORDER — PRAMIPEXOLE DIHYDROCHLORIDE 0.25 MG PO TABS
0.5000 mg | ORAL_TABLET | Freq: Every day | ORAL | Status: DC
  Administered 2021-07-18: 0.5 mg via ORAL
  Filled 2021-07-18: qty 2

## 2021-07-18 MED ORDER — ROSUVASTATIN CALCIUM 10 MG PO TABS
10.0000 mg | ORAL_TABLET | Freq: Every day | ORAL | Status: DC
  Administered 2021-07-18: 10 mg via ORAL
  Filled 2021-07-18: qty 1

## 2021-07-18 MED ORDER — ACETAMINOPHEN 325 MG PO TABS: 325.0000 mg | ORAL_TABLET | Freq: Four times a day (QID) | ORAL | Status: DC | PRN

## 2021-07-18 MED ORDER — LEVOTHYROXINE SODIUM 112 MCG PO TABS
112.0000 ug | ORAL_TABLET | Freq: Every day | ORAL | Status: DC
  Administered 2021-07-19: 112 ug via ORAL
  Filled 2021-07-18: qty 1

## 2021-07-18 MED ORDER — DEXAMETHASONE SODIUM PHOSPHATE 10 MG/ML IJ SOLN
INTRAMUSCULAR | Status: DC | PRN
  Administered 2021-07-18: 8 mg via INTRAVENOUS

## 2021-07-18 MED ORDER — POVIDONE-IODINE 10 % EX SWAB
2.0000 "application " | Freq: Once | CUTANEOUS | Status: AC
  Administered 2021-07-18: 2 via TOPICAL

## 2021-07-18 MED ORDER — BISACODYL 10 MG RE SUPP: 10.0000 mg | Freq: Every day | RECTAL | Status: DC | PRN

## 2021-07-18 MED ORDER — ASPIRIN EC 325 MG PO TBEC
325.0000 mg | DELAYED_RELEASE_TABLET | Freq: Two times a day (BID) | ORAL | Status: DC
  Administered 2021-07-19: 325 mg via ORAL
  Filled 2021-07-18: qty 1

## 2021-07-18 MED ORDER — MENTHOL 3 MG MT LOZG: 1.0000 | LOZENGE | OROMUCOSAL | Status: DC | PRN

## 2021-07-18 MED ORDER — DOCUSATE SODIUM 100 MG PO CAPS
100.0000 mg | ORAL_CAPSULE | Freq: Two times a day (BID) | ORAL | Status: DC
  Filled 2021-07-18: qty 1

## 2021-07-18 MED ORDER — BUPIVACAINE HCL 0.25 % IJ SOLN
INTRAMUSCULAR | Status: DC | PRN
  Administered 2021-07-18: 30 mL

## 2021-07-18 MED ORDER — PROPOFOL 500 MG/50ML IV EMUL
INTRAVENOUS | Status: DC | PRN
  Administered 2021-07-18: 40 ug/kg/min via INTRAVENOUS

## 2021-07-18 MED ORDER — GLYCOPYRROLATE PF 0.2 MG/ML IJ SOSY
PREFILLED_SYRINGE | INTRAMUSCULAR | Status: DC | PRN
  Administered 2021-07-18: .2 mg via INTRAVENOUS

## 2021-07-18 MED ORDER — ORAL CARE MOUTH RINSE: 15.0000 mL | Freq: Once | OROMUCOSAL | Status: AC

## 2021-07-18 MED ORDER — ALPRAZOLAM 1 MG PO TABS
1.0000 mg | ORAL_TABLET | Freq: Every day | ORAL | Status: DC
  Administered 2021-07-18: 1 mg via ORAL
  Filled 2021-07-18: qty 1

## 2021-07-18 MED ORDER — METHOCARBAMOL 500 MG IVPB - SIMPLE MED
500.0000 mg | Freq: Four times a day (QID) | INTRAVENOUS | Status: DC | PRN
  Filled 2021-07-18: qty 50

## 2021-07-18 MED ORDER — PHENYLEPHRINE HCL (PRESSORS) 10 MG/ML IV SOLN
INTRAVENOUS | Status: AC
  Filled 2021-07-18: qty 2

## 2021-07-18 MED ORDER — PHENYLEPHRINE HCL-NACL 20-0.9 MG/250ML-% IV SOLN
INTRAVENOUS | Status: DC | PRN
  Administered 2021-07-18: 30 ug/min via INTRAVENOUS

## 2021-07-18 MED ORDER — LACTATED RINGERS IV SOLN
INTRAVENOUS | Status: DC
Start: 2021-07-18 — End: 2021-07-18

## 2021-07-18 MED ORDER — CEFAZOLIN SODIUM-DEXTROSE 2-4 GM/100ML-% IV SOLN
2.0000 g | Freq: Four times a day (QID) | INTRAVENOUS | Status: AC
  Administered 2021-07-18 (×2): 2 g via INTRAVENOUS
  Filled 2021-07-18 (×2): qty 100

## 2021-07-18 SURGICAL SUPPLY — 43 items
BAG COUNTER SPONGE SURGICOUNT (BAG) IMPLANT
BAG DECANTER FOR FLEXI CONT (MISCELLANEOUS) IMPLANT
BAG ZIPLOCK 12X15 (MISCELLANEOUS) IMPLANT
BLADE SAG 18X100X1.27 (BLADE) ×2 IMPLANT
COVER PERINEAL POST (MISCELLANEOUS) ×2 IMPLANT
COVER SURGICAL LIGHT HANDLE (MISCELLANEOUS) ×2 IMPLANT
CUP ACETBLR 48 OD SECTOR II (Hips) ×2 IMPLANT
DECANTER SPIKE VIAL GLASS SM (MISCELLANEOUS) ×2 IMPLANT
DRAPE FOOT SWITCH (DRAPES) ×2 IMPLANT
DRAPE STERI IOBAN 125X83 (DRAPES) ×2 IMPLANT
DRAPE U-SHAPE 47X51 STRL (DRAPES) ×4 IMPLANT
DRESSING AQUACEL AG SP 3.5X10 (GAUZE/BANDAGES/DRESSINGS) ×1 IMPLANT
DRSG AQUACEL AG ADV 3.5X10 (GAUZE/BANDAGES/DRESSINGS) ×2 IMPLANT
DRSG AQUACEL AG SP 3.5X10 (GAUZE/BANDAGES/DRESSINGS) ×2
DURAPREP 26ML APPLICATOR (WOUND CARE) ×2 IMPLANT
ELECT REM PT RETURN 15FT ADLT (MISCELLANEOUS) ×2 IMPLANT
GLOVE SRG 8 PF TXTR STRL LF DI (GLOVE) ×1 IMPLANT
GLOVE SURG ENC MOIS LTX SZ6.5 (GLOVE) ×2 IMPLANT
GLOVE SURG ENC MOIS LTX SZ7 (GLOVE) ×2 IMPLANT
GLOVE SURG ENC MOIS LTX SZ8 (GLOVE) ×4 IMPLANT
GLOVE SURG UNDER POLY LF SZ7 (GLOVE) ×2 IMPLANT
GLOVE SURG UNDER POLY LF SZ8 (GLOVE) ×1
GLOVE SURG UNDER POLY LF SZ8.5 (GLOVE) IMPLANT
GOWN STRL REUS W/TWL LRG LVL3 (GOWN DISPOSABLE) ×4 IMPLANT
GOWN STRL REUS W/TWL XL LVL3 (GOWN DISPOSABLE) IMPLANT
HEAD FEM STD 28X+1.5 STRL (Hips) ×2 IMPLANT
HOLDER FOLEY CATH W/STRAP (MISCELLANEOUS) ×2 IMPLANT
KIT TURNOVER KIT A (KITS) ×2 IMPLANT
LINER ACET ALTRX NEUT +4X28X48 (Hips) ×2 IMPLANT
MANIFOLD NEPTUNE II (INSTRUMENTS) ×2 IMPLANT
PACK ANTERIOR HIP CUSTOM (KITS) ×2 IMPLANT
PENCIL SMOKE EVACUATOR COATED (MISCELLANEOUS) ×2 IMPLANT
STEM FEMORAL SZ5 HIGH ACTIS (Stem) ×2 IMPLANT
STRIP CLOSURE SKIN 1/2X4 (GAUZE/BANDAGES/DRESSINGS) ×2 IMPLANT
SUT ETHIBOND NAB CT1 #1 30IN (SUTURE) ×2 IMPLANT
SUT MNCRL AB 4-0 PS2 18 (SUTURE) ×2 IMPLANT
SUT STRATAFIX 0 PDS 27 VIOLET (SUTURE) ×2
SUT VIC AB 2-0 CT1 27 (SUTURE) ×2
SUT VIC AB 2-0 CT1 TAPERPNT 27 (SUTURE) ×2 IMPLANT
SUTURE STRATFX 0 PDS 27 VIOLET (SUTURE) ×1 IMPLANT
SYR 50ML LL SCALE MARK (SYRINGE) IMPLANT
TRAY FOLEY MTR SLVR 16FR STAT (SET/KITS/TRAYS/PACK) ×2 IMPLANT
TUBE SUCTION HIGH CAP CLEAR NV (SUCTIONS) ×2 IMPLANT

## 2021-07-18 NOTE — Transfer of Care (Signed)
Immediate Anesthesia Transfer of Care Note  Patient: Anne Pearson  Procedure(s) Performed: TOTAL HIP ARTHROPLASTY ANTERIOR APPROACH (Left: Hip)  Patient Location: PACU  Anesthesia Type:MAC and Spinal  Level of Consciousness: awake, alert , oriented and patient cooperative  Airway & Oxygen Therapy: Patient Spontanous Breathing and Patient connected to face mask oxygen  Post-op Assessment: Report given to RN, Post -op Vital signs reviewed and stable and Patient moving all extremities  Post vital signs: Reviewed and stable  Last Vitals:  Vitals Value Taken Time  BP 106/94 07/18/21 1137  Temp    Pulse 53 07/18/21 1138  Resp 16 07/18/21 1138  SpO2 100 % 07/18/21 1138  Vitals shown include unvalidated device data.  Last Pain:  Vitals:   07/18/21 0759  TempSrc:   PainSc: 0-No pain      Patients Stated Pain Goal: 4 (76/15/18 3437)  Complications: No notable events documented.

## 2021-07-18 NOTE — Discharge Instructions (Signed)
°Frank Aluisio, MD °Total Joint Specialist °[facility] °[address] °[place], [place] [address] °[phone] ° °ANTERIOR APPROACH TOTAL HIP REPLACEMENT POSTOPERATIVE DIRECTIONS ° ° ° ° °Hip Rehabilitation, Guidelines Following Surgery  °The results of a hip operation are greatly improved after range of motion and muscle strengthening exercises. Follow all safety measures which are given to protect your hip. If any of these exercises cause increased pain or swelling in your joint, decrease the amount until you are comfortable again. Then slowly increase the exercises. Call your caregiver if you have problems or questions.  ° °BLOOD CLOT PREVENTION °Take a 325 mg Aspirin two times a day for three weeks following surgery. Then take an 81 mg Aspirin once a day for three weeks. Then discontinue Aspirin. °You may resume your vitamins/supplements upon discharge from the hospital. °Do not take any NSAIDs (Advil, Aleve, Ibuprofen, Meloxicam, etc.) until you have discontinued the 325 mg Aspirin. ° °HOME CARE INSTRUCTIONS  °Remove items at home which could result in a fall. This includes throw rugs or furniture in walking pathways.  °ICE to the affected hip as frequently as 20-30 minutes an hour and then as needed for pain and swelling. Continue to use ice on the hip for pain and swelling from surgery. You may notice swelling that will progress down to the foot and ankle. This is normal after surgery. Elevate the leg when you are not up walking on it.   °Continue to use the breathing machine which will help keep your temperature down.  It is common for your temperature to cycle up and down following surgery, especially at night when you are not up moving around and exerting yourself.  The breathing machine keeps your lungs expanded and your temperature down. ° °DIET °You may resume your previous home diet once your are discharged from the hospital. ° °DRESSING / WOUND CARE / SHOWERING °You have  an adhesive waterproof bandage over the incision. Leave this in place until your first follow-up appointment. Once you remove this you will not need to place another bandage.  °You may begin showering 3 days following surgery, but do not submerge the incision under water. ° °ACTIVITY °For the first 3-5 days, it is important to rest and keep the operative leg elevated. You should, as a general rule, rest for 50 minutes and walk/stretch for 10 minutes per hour. After 5 days, you may slowly increase activity as tolerated.  °Perform the exercises you were provided twice a day for about 15-20 minutes each session. Begin these 2 days following surgery. °Walk with your walker as instructed. Use the walker until you are comfortable transitioning to a cane. Walk with the cane in the opposite hand of the operative leg. You may discontinue the cane once you are comfortable and walking steadily. °Avoid periods of inactivity such as sitting longer than an hour when not asleep. This helps prevent blood clots.  °Do not drive a car for 6 weeks or until released by your surgeon.  °Do not drive while taking narcotics. ° °TED HOSE STOCKINGS °Wear the elastic stockings on both legs for three weeks following surgery during the day. You may remove them at night while sleeping. ° °WEIGHT BEARING °Weight bearing as tolerated with assist device (walker, cane, etc) as directed, use it as long as suggested by your surgeon or therapist, typically at least 4-6 weeks. ° °POSTOPERATIVE CONSTIPATION PROTOCOL °Constipation - defined medically as fewer than three stools per week and severe constipation as   less than one stool per week. ° °One of the most common issues patients have following surgery is constipation.  Even if you have a regular bowel pattern at home, your normal regimen is likely to be disrupted due to multiple reasons following surgery.  Combination of anesthesia, postoperative narcotics, change in appetite and fluid intake all can  affect your bowels.  In order to avoid complications following surgery, here are some recommendations in order to help you during your recovery period. ° °Colace (docusate) - Pick up an over-the-counter form of Colace or another stool softener and take twice a day as long as you are requiring postoperative pain medications.  Take with a full glass of water daily.  If you experience loose stools or diarrhea, hold the colace until you stool forms back up.  If your symptoms do not get better within 1 week or if they get worse, check with your doctor. °Dulcolax (bisacodyl) - Pick up over-the-counter and take as directed by the product packaging as needed to assist with the movement of your bowels.  Take with a full glass of water.  Use this product as needed if not relieved by Colace only.  °MiraLax (polyethylene glycol) - Pick up over-the-counter to have on hand.  MiraLax is a solution that will increase the amount of water in your bowels to assist with bowel movements.  Take as directed and can mix with a glass of water, juice, soda, coffee, or tea.  Take if you go more than two days without a movement.Do not use MiraLax more than once per day. Call your doctor if you are still constipated or irregular after using this medication for 7 days in a row. ° °If you continue to have problems with postoperative constipation, please contact the office for further assistance and recommendations.  If you experience "the worst abdominal pain ever" or develop nausea or vomiting, please contact the office immediatly for further recommendations for treatment. ° °ITCHING ° If you experience itching with your medications, try taking only a single pain pill, or even half a pain pill at a time.  You can also use Benadryl over the counter for itching or also to help with sleep.  ° °MEDICATIONS °See your medication summary on the “After Visit Summary” that the nursing staff will review with you prior to discharge.  You may have some home  medications which will be placed on hold until you complete the course of blood thinner medication.  It is important for you to complete the blood thinner medication as prescribed by your surgeon.  Continue your approved medications as instructed at time of discharge. ° °PRECAUTIONS °If you experience chest pain or shortness of breath - call 911 immediately for transfer to the hospital emergency department.  °If you develop a fever greater that 101 F, purulent drainage from wound, increased redness or drainage from wound, foul odor from the wound/dressing, or calf pain - CONTACT YOUR SURGEON.   °                                                °FOLLOW-UP APPOINTMENTS °Make sure you keep all of your appointments after your operation with your surgeon and caregivers. You should call the office at the above phone number and make an appointment for approximately two weeks after the date of your surgery or on the   date instructed by your surgeon outlined in the "After Visit Summary". ° °RANGE OF MOTION AND STRENGTHENING EXERCISES  °These exercises are designed to help you keep full movement of your hip joint. Follow your caregiver's or physical therapist's instructions. Perform all exercises about fifteen times, three times per day or as directed. Exercise both hips, even if you have had only one joint replacement. These exercises can be done on a training (exercise) mat, on the floor, on a table or on a bed. Use whatever works the best and is most comfortable for you. Use music or television while you are exercising so that the exercises are a pleasant break in your day. This will make your life better with the exercises acting as a break in routine you can look forward to.  °Lying on your back, slowly slide your foot toward your buttocks, raising your knee up off the floor. Then slowly slide your foot back down until your leg is straight again.  °Lying on your back spread your legs as far apart as you can without causing  discomfort.  °Lying on your side, raise your upper leg and foot straight up from the floor as far as is comfortable. Slowly lower the leg and repeat.  °Lying on your back, tighten up the muscle in the front of your thigh (quadriceps muscles). You can do this by keeping your leg straight and trying to raise your heel off the floor. This helps strengthen the largest muscle supporting your knee.  °Lying on your back, tighten up the muscles of your buttocks both with the legs straight and with the knee bent at a comfortable angle while keeping your heel on the floor.  ° °POST-OPERATIVE OPIOID TAPER INSTRUCTIONS: °It is important to wean off of your opioid medication as soon as possible. If you do not need pain medication after your surgery it is ok to stop day one. °Opioids include: °Codeine, Hydrocodone(Norco, Vicodin), Oxycodone(Percocet, oxycontin) and hydromorphone amongst others.  °Long term and even short term use of opiods can cause: °Increased pain response °Dependence °Constipation °Depression °Respiratory depression °And more.  °Withdrawal symptoms can include °Flu like symptoms °Nausea, vomiting °And more °Techniques to manage these symptoms °Hydrate well °Eat regular healthy meals °Stay active °Use relaxation techniques(deep breathing, meditating, yoga) °Do Not substitute Alcohol to help with tapering °If you have been on opioids for less than two weeks and do not have pain than it is ok to stop all together.  °Plan to wean off of opioids °This plan should start within one week post op of your joint replacement. °Maintain the same interval or time between taking each dose and first decrease the dose.  °Cut the total daily intake of opioids by one tablet each day °Next start to increase the time between doses. °The last dose that should be eliminated is the evening dose.  ° °IF YOU ARE TRANSFERRED TO A SKILLED REHAB FACILITY °If the patient is transferred to a skilled rehab facility following release from the  hospital, a list of the current medications will be sent to the facility for the patient to continue.  When discharged from the skilled rehab facility, please have the facility set up the patient's Home Health Physical Therapy prior to being released. Also, the skilled facility will be responsible for providing the patient with their medications at time of release from the facility to include their pain medication, the muscle relaxants, and their blood thinner medication. If the patient is still at the rehab facility   at time of the two week follow up appointment, the skilled rehab facility will also need to assist the patient in arranging follow up appointment in our office and any transportation needs. ° °MAKE SURE YOU:  °Understand these instructions.  °Get help right away if you are not doing well or get worse.  ° ° °DENTAL ANTIBIOTICS: ° °In most cases prophylactic antibiotics for Dental procdeures after total joint surgery are not necessary. ° °Exceptions are as follows: ° °1. History of prior total joint infection ° °2. Severely immunocompromised (Organ Transplant, cancer chemotherapy, Rheumatoid biologic °meds such as Humera) ° °3. Poorly controlled diabetes (A1C &gt; 8.0, blood glucose over 200) ° °If you have one of these conditions, contact your surgeon for an antibiotic prescription, prior to your °dental procedure.  ° ° °Pick up stool softner and laxative for home use following surgery while on pain medications. °Do not submerge incision under water. °Please use good hand washing techniques while changing dressing each day. °May shower starting three days after surgery. °Please use a clean towel to pat the incision dry following showers. °Continue to use ice for pain and swelling after surgery. °Do not use any lotions or creams on the incision until instructed by your surgeon. ° °

## 2021-07-18 NOTE — Op Note (Signed)
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   POSTOPERATIVE DIAGNOSIS: Osteoarthritis of the Left  hip.   PROCEDURE: Left total hip arthroplasty, anterior approach.   SURGEON: Gaynelle Arabian, MD   ASSISTANT: Theresa Duty, PA-C  ANESTHESIA:  Spinal  ESTIMATED BLOOD LOSS:-550 mL    DRAINS: Hemovac x1.   COMPLICATIONS: None   CONDITION: PACU - hemodynamically stable.   BRIEF CLINICAL NOTE: Anne Pearson is a 80 y.o. female who has advanced end-  stage arthritis of their Left  hip with progressively worsening pain and  dysfunction.The patient has failed nonoperative management and presents for  total hip arthroplasty.   PROCEDURE IN DETAIL: After successful administration of spinal  anesthetic, the traction boots for the [facility] bed were placed on both  feet and the patient was placed onto the [facility] bed, boots placed into the leg  holders. The Left hip was then isolated from the perineum with plastic  drapes and prepped and draped in the usual sterile fashion. ASIS and  greater trochanter were marked and a oblique incision was made, starting  at about 1 cm lateral and 2 cm distal to the ASIS and coursing towards  the anterior cortex of the femur. The skin was cut with a 10 blade  through subcutaneous tissue to the level of the fascia overlying the  tensor fascia lata muscle. The fascia was then incised in line with the  incision at the junction of the anterior third and posterior 2/3rd. The  muscle was teased off the fascia and then the interval between the TFL  and the rectus was developed. The Hohmann retractor was then placed at  the top of the femoral neck over the capsule. The vessels overlying the  capsule were cauterized and the fat on top of the capsule was removed.  A Hohmann retractor was then placed anterior underneath the rectus  femoris to give exposure to the entire anterior capsule. A T-shaped   capsulotomy was performed. The edges were tagged and the femoral head  was identified.       Osteophytes are removed off the superior acetabulum.  The femoral neck was then cut in situ with an oscillating saw. Traction  was then applied to the left lower extremity utilizing the [facility]  traction. The femoral head was then removed. Retractors were placed  around the acetabulum and then circumferential removal of the labrum was  performed. Osteophytes were also removed. Reaming starts at 45 mm to  medialize and  Increased in 2 mm increments to 47 mm. We reamed in  approximately 40 degrees of abduction, 20 degrees anteversion. A 48 mm  pinnacle acetabular shell was then impacted in anatomic position under  fluoroscopic guidance with excellent purchase. We did not need to place  any additional dome screws. A 28 mm neutral + 4 marathon liner was then  placed into the acetabular shell.       The femoral lift was then placed along the lateral aspect of the femur  just distal to the vastus ridge. The leg was  externally rotated and capsule  was stripped off the inferior aspect of the femoral neck down to the  level of the lesser trochanter, this was done with electrocautery. The femur was lifted after this was performed. The  leg was then placed in an extended and adducted position essentially delivering the femur. We also removed the capsule superiorly and the piriformis from the piriformis fossa  to gain excellent exposure of the  proximal femur. Rongeur was used to remove some cancellous bone to get  into the lateral portion of the proximal femur for placement of the  initial starter reamer. The starter broaches was placed  the starter broach  and was shown to go down the center of the canal. Broaching  with the Actis system was then performed starting at size 0  coursing  Up to size 5. A size 5 had excellent torsional and rotational  and axial stability. The trial high offset neck was then placed   with a 28 + 1.5 trial head. The hip was then reduced. We confirmed that  the stem was in the canal both on AP and lateral x-rays. It also has excellent sizing. The hip was reduced with outstanding stability through full extension and full external rotation.. AP pelvis was taken and the leg lengths were measured and found to be equal. Hip was then dislocated again and the femoral head and neck removed. The  femoral broach was removed. Size 5 Actis stem with a high offset  neck was then impacted into the femur following native anteversion. Has  excellent purchase in the canal. Excellent torsional and rotational and  axial stability. It is confirmed to be in the canal on AP and lateral  fluoroscopic views. The 28 + 1.5 metal head was placed and the hip  reduced with outstanding stability. Again AP pelvis was taken and it  confirmed that the leg lengths were equal. The wound was then copiously  irrigated with saline solution and the capsule reattached and repaired  with Ethibond suture. 30 ml of .25% Bupivicaine was  injected into the capsule and into the edge of the tensor fascia lata as well as subcutaneous tissue. The fascia overlying the tensor fascia lata was then closed with a running #1 V-Loc. Subcu was closed with interrupted 2-0 Vicryl and subcuticular running 4-0 Monocryl. Incision was cleaned  and dried. Steri-Strips and a bulky sterile dressing applied. The patient was awakened and transported to  recovery in stable condition.        Please note that a surgical assistant was a medical necessity for this procedure to perform it in a safe and expeditious manner. Assistant was necessary to provide appropriate retraction of vital neurovascular structures and to prevent femoral fracture and allow for anatomic placement of the prosthesis.  Gaynelle Arabian, M.D.

## 2021-07-18 NOTE — Anesthesia Postprocedure Evaluation (Signed)
Anesthesia Post Note  Patient: Anne Pearson  Procedure(s) Performed: TOTAL HIP ARTHROPLASTY ANTERIOR APPROACH (Left: Hip)     Patient location during evaluation: PACU Anesthesia Type: Spinal Level of consciousness: awake Pain management: pain level controlled Vital Signs Assessment: post-procedure vital signs reviewed and stable Respiratory status: spontaneous breathing, respiratory function stable and patient connected to nasal cannula oxygen Cardiovascular status: blood pressure returned to baseline and stable Postop Assessment: no headache, no backache and no apparent nausea or vomiting Anesthetic complications: no   No notable events documented.  Last Vitals:  Vitals:   07/18/21 1520 07/18/21 1700  BP: (!) 125/54 (!) 101/53  Pulse: (!) 46 (!) 53  Resp: 14 16  Temp:  36.7 C  SpO2: 96% 93%    Last Pain:  Vitals:   07/18/21 1700  TempSrc: Oral  PainSc:                  Anne Pearson

## 2021-07-18 NOTE — Interval H&P Note (Signed)
History and Physical Interval Note:  07/18/2021 7:29 AM  [name]  has presented today for surgery, with the diagnosis of left hip osteoarthritis.  The various methods of treatment have been discussed with the patient and family. After consideration of risks, benefits and other options for treatment, the patient has consented to  Procedure(s): TOTAL HIP ARTHROPLASTY ANTERIOR APPROACH (Left) as a surgical intervention.  The patient's history has been reviewed, patient examined, no change in status, stable for surgery.  I have reviewed the patient's chart and labs.  Questions were answered to the patient's satisfaction.     Anne Pearson

## 2021-07-18 NOTE — Evaluation (Signed)
Physical Therapy Evaluation Patient Details Name: Anne Pearson MRN: [id number] DOB: 08/10/1941 Today's Date: 07/18/2021   History of Present Illness  Patient is 80 y.o. female s/p Lt THA anterior approch with PMH significant for tachycardia, OA, IBS, HTN, hypothyroidism, GERD, anxiety, Rt THA, Rt UKA, Lt TKA with 2 revisions (2015, and 2021).    Clinical Impression  Anne Pearson is a 80 y.o. female POD 0 s/p Lt THA. Patient reports independence with mobility at baseline. Patient is now limited by functional impairments (see PT problem list below) and requires min guard/assist for transfers and gait with RW. Patient was able to ambulate ~80 feet with RW and min guard/assist. Patient instructed in exercise to facilitate circulation to reduce risk of DVT. Patient will benefit from continued skilled PT interventions to address impairments and progress towards PLOF. Acute PT will follow to progress mobility and stair training in preparation for safe discharge home.     Follow Up Recommendations Follow surgeon's recommendation for DC plan and follow-up therapies    Equipment Recommendations  None recommended by PT    Recommendations for Other Services       Precautions / Restrictions Precautions Precautions: Fall Restrictions Weight Bearing Restrictions: No      Mobility  Bed Mobility Overal bed mobility: Needs Assistance Bed Mobility: Supine to Sit     Supine to sit: Min guard;HOB elevated     General bed mobility comments: cues to use bed rail, no assist needed and pt using UE's to bring Lt LE off EOB.    Transfers Overall transfer level: Needs assistance Equipment used: Rolling walker (2 wheeled) Transfers: Sit to/from Stand Sit to Stand: Min assist         General transfer comment: cues for hand placement with power up, light assist to initiate rise.  Ambulation/Gait Ambulation/Gait assistance: Min assist;Min guard Gait Distance (Feet): 80 Feet Assistive device:  Rolling walker (2 wheeled) Gait Pattern/deviations: Step-to pattern;Decreased stride length;Decreased weight shift to left Gait velocity: decr   General Gait Details: cues for safe step patter and proximity to RW, pt maintained throughout and progressed to min guard for safety.  Stairs            Wheelchair Mobility    Modified Rankin (Stroke Patients Only)       Balance Overall balance assessment: Needs assistance Sitting-balance support: Feet supported Sitting balance-Leahy Scale: Good     Standing balance support: During functional activity;Bilateral upper extremity supported Standing balance-Leahy Scale: Fair                               Pertinent Vitals/Pain Pain Assessment: 0-10 Pain Score: 2  Pain Location: Lt hip Pain Descriptors / Indicators: Aching;Discomfort Pain Intervention(s): Limited activity within patient's tolerance;Monitored during session;Ice applied;Repositioned    Home Living Family/patient expects to be discharged to:: Private residence Living Arrangements: Spouse/significant other Available Help at Discharge: Family Type of Home: House Home Access: Stairs to enter Entrance Stairs-Rails: None Entrance Stairs-Number of Steps: 1 Home Layout: Two level;Full bath on main level;Able to live on main level with bedroom/bathroom Home Equipment: Gilford Rile - 2 wheels;Cane - single point;Toilet riser;Shower seat Additional Comments: can pull up on vanity    Prior Function Level of Independence: Independent               Hand Dominance   Dominant Hand: Left    Extremity/Trunk Assessment   Upper Extremity Assessment Upper Extremity Assessment: Overall [facility]  for tasks assessed    Lower Extremity Assessment Lower Extremity Assessment: Overall WFL for tasks assessed    Cervical / Trunk Assessment Cervical / Trunk Assessment: Normal  Communication   Communication: No difficulties  Cognition Arousal/Alertness:  Awake/alert Behavior During Therapy: WFL for tasks assessed/performed Overall Cognitive Status: Within Functional Limits for tasks assessed                                        General Comments      Exercises Total Joint Exercises Ankle Circles/Pumps: AROM;Both;20 reps;Seated   Assessment/Plan    PT Assessment Patient needs continued PT services  PT Problem List Decreased strength;Decreased range of motion;Decreased activity tolerance;Decreased balance;Decreased mobility;Decreased knowledge of use of DME;Decreased knowledge of precautions;Pain       PT Treatment Interventions DME instruction;Gait training;Stair training;Functional mobility training;Therapeutic activities;Therapeutic exercise;Balance training;Patient/family education    PT Goals (Current goals can be found in the Care Plan section)  Acute Rehab PT Goals Patient Stated Goal: get recovered PT Goal Formulation: With patient Time For Goal Achievement: 07/25/21 Potential to Achieve Goals: Good    Frequency 7X/week   Barriers to discharge        Co-evaluation               AM-PAC PT "6 Clicks" Mobility  Outcome Measure Help needed turning from your back to your side while in a flat bed without using bedrails?: None Help needed moving from lying on your back to sitting on the side of a flat bed without using bedrails?: A Little Help needed moving to and from a bed to a chair (including a wheelchair)?: A Little Help needed standing up from a chair using your arms (e.g., wheelchair or bedside chair)?: A Little Help needed to walk in hospital room?: A Little Help needed climbing 3-5 steps with a railing? : A Little 6 Click Score: 19    End of Session Equipment Utilized During Treatment: Gait belt Activity Tolerance: Patient tolerated treatment well Patient left: in chair;with call bell/phone within reach;with chair alarm set;with family/visitor present Nurse Communication: Mobility  status PT Visit Diagnosis: Muscle weakness (generalized) (M62.81);Difficulty in walking, not elsewhere classified (R26.2)    Time: [id number] PT Time Calculation (min) (ACUTE ONLY): 15 min   Charges:   PT Evaluation $PT Eval Low Complexity: 1 Low          Verner Mould, DPT Acute Rehabilitation Services Office [phone] Pager [phone]   Jacques Navy 07/18/2021, 5:28 PM

## 2021-07-18 NOTE — Anesthesia Procedure Notes (Signed)
Spinal  Patient location during procedure: OR Start time: 07/18/2021 9:57 AM End time: 07/18/2021 10:02 AM Reason for block: surgical anesthesia Staffing Performed: resident/CRNA  Resident/CRNA: Cleda Daub, CRNA Preanesthetic Checklist Completed: patient identified, IV checked, site marked, risks and benefits discussed, surgical consent, monitors and equipment checked, pre-op evaluation and timeout performed Spinal Block Patient position: sitting Prep: DuraPrep Patient monitoring: heart rate, cardiac monitor, continuous pulse ox and blood pressure Approach: midline Location: L3-4 Injection technique: single-shot Needle Needle type: Pencan  Needle gauge: 24 G Needle length: 10 cm Assessment Sensory level: T4 Events: CSF return Additional Notes Spinal kit and duraprep expiration dates checked; aseptic technique maintained throughout the procedure; landmarks palpated; skin infiltration with 1% lidocaine; clear CSF prior to and after injection; swirl during injection. Pt tolerated well.

## 2021-07-19 ENCOUNTER — Encounter (HOSPITAL_COMMUNITY): Payer: Self-pay | Admitting: Orthopedic Surgery

## 2021-07-19 DIAGNOSIS — M1612 Unilateral primary osteoarthritis, left hip: Secondary | ICD-10-CM | POA: Diagnosis not present

## 2021-07-19 LAB — BASIC METABOLIC PANEL
Anion gap: 7 (ref 5–15)
BUN: 19 mg/dL (ref 8–23)
CO2: 27 mmol/L (ref 22–32)
Calcium: 8.2 mg/dL — ABNORMAL LOW (ref 8.9–10.3)
Chloride: 103 mmol/L (ref 98–111)
Creatinine, Ser: 0.68 mg/dL (ref 0.44–1.00)
GFR, Estimated: >60 mL/min (ref >60)
Glucose, Bld: 149 mg/dL — ABNORMAL HIGH (ref 70–99)
Potassium: 4.2 mmol/L (ref 3.5–5.1)
Sodium: 137 mmol/L (ref 135–145)

## 2021-07-19 LAB — CBC
HCT: 30.4 % — ABNORMAL LOW (ref 36.0–46.0)
Hemoglobin: 9.7 g/dL — ABNORMAL LOW (ref 12.0–15.0)
MCH: 29.4 pg (ref 26.0–34.0)
MCHC: 31.9 g/dL (ref 30.0–36.0)
MCV: 92.1 fL (ref 80.0–100.0)
Platelets: 152 10*3/uL (ref 150–400)
RBC: 3.3 MIL/uL — ABNORMAL LOW (ref 3.87–5.11)
RDW: 12.5 % (ref 11.5–15.5)
WBC: 8.8 10*3/uL (ref 4.0–10.5)
nRBC: 0 % (ref 0.0–0.2)

## 2021-07-19 MED ORDER — TRAMADOL HCL 50 MG PO TABS: 50.0000 mg | ORAL_TABLET | Freq: Four times a day (QID) | ORAL | 0 refills | Status: AC | PRN

## 2021-07-19 MED ORDER — SODIUM CHLORIDE 0.9 % IV BOLUS
250.0000 mL | Freq: Once | INTRAVENOUS | Status: AC
  Administered 2021-07-19: 250 mL via INTRAVENOUS

## 2021-07-19 MED ORDER — ASPIRIN 325 MG PO TBEC: 325.0000 mg | DELAYED_RELEASE_TABLET | Freq: Two times a day (BID) | ORAL | 0 refills | Status: AC

## 2021-07-19 MED ORDER — METHOCARBAMOL 500 MG PO TABS: 500.0000 mg | ORAL_TABLET | Freq: Four times a day (QID) | ORAL | 0 refills | Status: AC | PRN

## 2021-07-19 MED ORDER — HYDROCODONE-ACETAMINOPHEN 5-325 MG PO TABS: 1.0000 | ORAL_TABLET | Freq: Four times a day (QID) | ORAL | 0 refills | Status: AC | PRN

## 2021-07-19 NOTE — Progress Notes (Signed)
   Subjective: 1 Day Post-Op Procedure(s) (LRB): TOTAL HIP ARTHROPLASTY ANTERIOR APPROACH (Left) Patient reports pain as mild.   Patient seen in rounds by Dr. Wynelle Link. Patient is well, and has had no acute complaints or problems. Denies chest pain or SOB. No issues overnight. Foley catheter removed this AM. We will continue therapy today, ambulated 80' yesterday.   Objective: Vital signs in last 24 hours: Temp:  [97.5 F (36.4 C)-98.3 F (36.8 C)] 98.3 F (36.8 C) (09/08 0402) Pulse Rate:  [42-56] 50 (09/08 0402) Resp:  [12-23] 18 (09/08 0402) BP: (101-141)/(50-97) 114/52 (09/08 0402) SpO2:  [93 %-100 %] 96 % (09/08 0402) Weight:  [53 kg] 53 kg (09/07 0759)  Intake/Output from previous day:  Intake/Output Summary (Last 24 hours) at 07/19/2021 0721 Last data filed at 07/19/2021 0532 Gross per 24 hour  Intake 3141.11 ml  Output 2950 ml  Net 191.11 ml     Intake/Output this shift: No intake/output data recorded.  Labs: Recent Labs    07/19/21 0322  HGB 9.7*   Recent Labs    07/19/21 0322  WBC 8.8  RBC 3.30*  HCT 30.4*  PLT 152   Recent Labs    07/19/21 0322  NA 137  K 4.2  CL 103  CO2 27  BUN 19  CREATININE 0.68  GLUCOSE 149*  CALCIUM 8.2*   No results for input(s): LABPT, INR in the last 72 hours.  Exam: General - Patient is Alert and Oriented Extremity - Neurologically intact Neurovascular intact Sensation intact distally Dorsiflexion/Plantar flexion intact Dressing - dressing C/D/I Motor Function - intact, moving foot and toes well on exam.   Past Medical History:  Diagnosis Date   Basal cell carcinoma    face   Cataract    Small   DDD (degenerative disc disease), cervical    GERD (gastroesophageal reflux disease)    History of carpal tunnel release    Left    History of iron deficiency anemia    in childhood   History of multinodular goiter    Hypercholesterolemia    Hypertension    Hypothyroidism    IBS (irritable bowel syndrome)     OA (osteoarthritis)    Pneumonia    as a child   Restless leg syndrome    S/P radioactive iodine thyroid ablation    Tachycardia     Assessment/Plan: 1 Day Post-Op Procedure(s) (LRB): TOTAL HIP ARTHROPLASTY ANTERIOR APPROACH (Left) Principal Problem:   OA (osteoarthritis) of hip Active Problems:   S/P total left hip arthroplasty  Estimated body mass index is 19.75 kg/m as calculated from the following:   Height as of this encounter: 5' 4.5" (1.638 m).   Weight as of this encounter: 53 kg. Advance diet Up with therapy  DVT Prophylaxis - Aspirin Weight bearing as tolerated. Continue therapy.  BP soft overnight/this morning, 250 mL bolus ordered.  Plan is to go Home after hospital stay. Plan for discharge to home with HEP after one session of PT this AM. Follow-up in the office in 2 weeks  The [facility] was reviewed today prior to any opioid medications being prescribed to this patient.  Theresa Duty, PA-C Orthopedic Surgery [phone] 07/19/2021, 7:21 AM

## 2021-07-19 NOTE — Progress Notes (Signed)
Physical Therapy Treatment Patient Details Name: Anne Pearson MRN: [id number] DOB: 06-Sep-1941 Today's Date: 07/19/2021    History of Present Illness Patient is 80 y.o. female s/p Lt THA anterior approch with PMH significant for tachycardia, OA, IBS, HTN, hypothyroidism, GERD, anxiety, Rt THA, Rt UKA, Lt TKA with 2 revisions (2015, and 2021).    PT Comments    POD # 1 am session General Comments: AxO x 3 very pleasant and knowledgable having had previous total joint surgeries "I had 4 knee surgeries on 2 knees". Assisted OOB to amb in hallway, practice one step she has to enter home then Then returned to room to perform some TE's following HEP handout.  Instructed on proper tech, freq as well as use of ICE.   Addressed all mobility questions, discussed appropriate activity, educated on use of ICE.  Pt ready for D/C to home.   Follow Up Recommendations  Follow surgeon's recommendation for DC plan and follow-up therapies (HEP)     Equipment Recommendations  None recommended by PT    Recommendations for Other Services       Precautions / Restrictions Precautions Precautions: Fall Restrictions Weight Bearing Restrictions: No LLE Weight Bearing: Weight bearing as tolerated    Mobility  Bed Mobility Overal bed mobility: Modified Independent             General bed mobility comments: demonstarted and instructed how to use a belt to self assist LE on/off bed.    Transfers Overall transfer level: Modified independent Equipment used: Rolling walker (2 wheeled) Transfers: Sit to/from [facility] Sit to Stand: Modified independent (Device/Increase time) Stand pivot transfers: Modified independent (Device/Increase time)       General transfer comment: good safety cognition and use of hands correctly  Ambulation/Gait Ambulation/Gait assistance: Supervision Gait Distance (Feet): 125 Feet   Gait Pattern/deviations: Step-to pattern;Decreased stride  length;Decreased weight shift to left Gait velocity: decr   General Gait Details: only one VC on safety with turns.   Stairs Stairs: Yes Stairs assistance: Supervision Stair Management: No rails;Step to pattern;Forwards;With walker Number of Stairs: 1 General stair comments: 25% VC's on proper tech/sequencing performed twice   Wheelchair Mobility    Modified Rankin (Stroke Patients Only)       Balance                                            Cognition Arousal/Alertness: Awake/alert Behavior During Therapy: WFL for tasks assessed/performed Overall Cognitive Status: Within Functional Limits for tasks assessed                                 General Comments: AxO x 3 very pleasant and knowledgable having had previous total joint surgeries "I had 4 knee surgeries on 2 knees".      Exercises  Total Hip Replacement TE's following HEP Handout 10 reps ankle pumps 05 reps knee presses 05 reps heel slides 05 reps SAQ's 05 reps ABD 05 reps LAQ's 05 reps all standing TE's  Instructed how to use a belt loop to assist  Followed by ICE     General Comments        Pertinent Vitals/Pain Pain Assessment: 0-10 Pain Score: 2  Pain Location: Lt hip Pain Descriptors / Indicators: Aching;Discomfort;Operative site guarding Pain Intervention(s): Monitored during session;Premedicated before session;Repositioned;Ice applied  Home Living                      Prior Function            PT Goals (current goals can now be found in the care plan section) Progress towards PT goals: Progressing toward goals    Frequency    7X/week      PT Plan Current plan remains appropriate    Co-evaluation              AM-PAC PT "6 Clicks" Mobility   Outcome Measure  Help needed turning from your back to your side while in a flat bed without using bedrails?: None Help needed moving from lying on your back to sitting on the side of a  flat bed without using bedrails?: None Help needed moving to and from a bed to a chair (including a wheelchair)?: None Help needed standing up from a chair using your arms (e.g., wheelchair or bedside chair)?: None Help needed to walk in hospital room?: A Little Help needed climbing 3-5 steps with a railing? : A Little 6 Click Score: 22    End of Session Equipment Utilized During Treatment: Gait belt Activity Tolerance: Patient tolerated treatment well Patient left: in bed;with call bell/phone within reach;with bed alarm set Nurse Communication: Mobility status (pt ready for D/C to home after just one session) PT Visit Diagnosis: Muscle weakness (generalized) (M62.81);Difficulty in walking, not elsewhere classified (R26.2)     Time: 1035-1100 PT Time Calculation (min) (ACUTE ONLY): 25 min  Charges:  $Gait Training: 8-22 mins $Therapeutic Exercise: 8-22 mins                     {Anne Pearson  PTA Acute  Rehabilitation Services Pager      [phone] Office      [phone]

## 2021-07-19 NOTE — Progress Notes (Signed)
Discharge instructions given to patient and all questions were answered.  

## 2021-07-19 NOTE — TOC Transition Note (Signed)
Transition of Care [facility]) - CM/SW Discharge Note  Patient Details  Name: Anne Pearson MRN: [id number] Date of Birth: [birth year]  Transition of Care [facility] CM/SW Contact:  Sherie Don, LCSW Phone Number: 07/19/2021, 9:25 AM  Clinical Narrative: Patient is expected to discharge home after working with PT. CSW met with patient to review discharge plan. Patient will discharge with a home exercise program (HEP). Patient has a rolling walker and toilet riser, so there are no DME needs at this time. TOC signing off.  Final next level of care: Home/Self Care Barriers to Discharge: No Barriers Identified  Patient Goals and CMS Choice Patient states their goals for this hospitalization and ongoing recovery are:: Discharge home with HEP CMS Medicare.gov Compare Post Acute Care list provided to:: Patient Choice offered to / list presented to : NA  Discharge Plan and Services        DME Arranged: N/A DME Agency: NA  Readmission Risk Interventions No flowsheet data found.

## 2021-07-30 NOTE — Discharge Summary (Signed)
Physician Discharge Summary   Patient ID: Anne Pearson MRN: [id number] DOB/AGE: 03/13/1941 44 y.o.  Admit date: 07/18/2021 Discharge date: 07/19/2021  Primary Diagnosis: Osteoarthritis, left hip   Admission Diagnoses:  Past Medical History:  Diagnosis Date   Basal cell carcinoma    face   Cataract    Small   DDD (degenerative disc disease), cervical    GERD (gastroesophageal reflux disease)    History of carpal tunnel release    Left    History of iron deficiency anemia    in childhood   History of multinodular goiter    Hypercholesterolemia    Hypertension    Hypothyroidism    IBS (irritable bowel syndrome)    OA (osteoarthritis)    Pneumonia    as a child   Restless leg syndrome    Anne/P radioactive iodine thyroid ablation    Tachycardia    Discharge Diagnoses:   Principal Problem:   OA (osteoarthritis) of hip Active Problems:   Anne/P total left hip arthroplasty  Estimated body mass index is 19.75 kg/m as calculated from the following:   Height as of this encounter: 5' 4.5" (1.638 m).   Weight as of this encounter: 53 kg.  Procedure:  Procedure(Anne) (LRB): TOTAL HIP ARTHROPLASTY ANTERIOR APPROACH (Left)   Consults: None  HPI: Anne Pearson is a 80 y.o. female who has advanced end-  stage arthritis of their Left  hip with progressively worsening pain and dysfunction.The patient has failed nonoperative management and presents for total hip arthroplasty.   Laboratory Data: Admission on 07/18/2021, Discharged on 07/19/2021  Component Date Value Ref Range Status   WBC 07/19/2021 8.8  4.0 - 10.5 K/uL Final   RBC 07/19/2021 3.30 (A) 3.87 - 5.11 MIL/uL Final   Hemoglobin 07/19/2021 9.7 (A) 12.0 - 15.0 g/dL Final   HCT 07/19/2021 30.4 (A) 36.0 - 46.0 % Final   MCV 07/19/2021 92.1  80.0 - 100.0 fL Final   MCH 07/19/2021 29.4  26.0 - 34.0 pg Final   MCHC 07/19/2021 31.9  30.0 - 36.0 g/dL Final   RDW 07/19/2021 12.5  11.5 - 15.5 % Final   Platelets 07/19/2021 152  150  - 400 K/uL Final   nRBC 07/19/2021 0.0  0.0 - 0.2 % Final   Performed at [facility], [place] [address] [place], [place] [address]   Sodium 07/19/2021 137  135 - 145 mmol/L Final   Potassium 07/19/2021 4.2  3.5 - 5.1 mmol/L Final   Chloride 07/19/2021 103  98 - 111 mmol/L Final   CO2 07/19/2021 27  22 - 32 mmol/L Final   Glucose, Bld 07/19/2021 149 (A) 70 - 99 mg/dL Final   Glucose reference range applies only to samples taken after fasting for at least 8 hours.   BUN 07/19/2021 19  8 - 23 mg/dL Final   Creatinine, Ser 07/19/2021 0.68  0.44 - 1.00 mg/dL Final   Calcium 07/19/2021 8.2 (A) 8.9 - 10.3 mg/dL Final   GFR, Estimated 07/19/2021 >60  >60 mL/min Final   Comment: (NOTE) Calculated using the CKD-EPI Creatinine Equation (2021)    Anion gap 07/19/2021 7  5 - 15 Final   Performed at [facility], [place] [address] [place], [place] [address]  Hospital Outpatient Visit on 07/06/2021  Component Date Value Ref Range Status   MRSA, PCR 07/06/2021 NEGATIVE  NEGATIVE Final   Staphylococcus aureus 07/06/2021 NEGATIVE  NEGATIVE Final   Comment: (NOTE) The Xpert SA Assay (FDA approved  for NASAL specimens in patients 32 years of age and older), is one component of a comprehensive surveillance program. It is not intended to diagnose infection nor to guide or monitor treatment. Performed at [facility], [place] [address] [place], [place] [address]    WBC 07/06/2021 4.4  4.0 - 10.5 K/uL Final   RBC 07/06/2021 4.26  3.87 - 5.11 MIL/uL Final   Hemoglobin 07/06/2021 12.6  12.0 - 15.0 g/dL Final   HCT 07/06/2021 39.5  36.0 - 46.0 % Final   MCV 07/06/2021 92.7  80.0 - 100.0 fL Final   MCH 07/06/2021 29.6  26.0 - 34.0 pg Final   MCHC 07/06/2021 31.9  30.0 - 36.0 g/dL Final   RDW 07/06/2021 12.6  11.5 - 15.5 % Final   Platelets 07/06/2021 181  150 - 400 K/uL Final   nRBC 07/06/2021 0.0  0.0 - 0.2 % Final   Performed at [facility], [place] [address], [place], [place] [address]   Sodium 07/06/2021 136  135 - 145 mmol/L Final   Potassium 07/06/2021 4.4  3.5 - 5.1 mmol/L Final   Chloride 07/06/2021 102  98 - 111 mmol/L Final   CO2 07/06/2021 27  22 - 32 mmol/L Final   Glucose, Bld 07/06/2021 90  70 - 99 mg/dL Final   Glucose reference range applies only to samples taken after fasting for at least 8 hours.   BUN 07/06/2021 24 (A) 8 - 23 mg/dL Final   Creatinine, Ser 07/06/2021 0.55  0.44 - 1.00 mg/dL Final   Calcium 07/06/2021 9.0  8.9 - 10.3 mg/dL Final   Total Protein 07/06/2021 7.0  6.5 - 8.1 g/dL Final   Albumin 07/06/2021 4.2  3.5 - 5.0 g/dL Final   AST 07/06/2021 23  15 - 41 U/L Final   ALT 07/06/2021 18  0 - 44 U/L Final   Alkaline Phosphatase 07/06/2021 70  38 - 126 U/L Final   Total Bilirubin 07/06/2021 1.2  0.3 - 1.2 mg/dL Final   GFR, Estimated 07/06/2021 >60  >60 mL/min Final   Comment: (NOTE) Calculated using the CKD-EPI Creatinine Equation (2021)    Anion gap 07/06/2021 7  5 - 15 Final   Performed at [facility], [place] [address] [place], [place] [address]   ABO/RH(D) 07/06/2021 O POS   Final   Antibody Screen 07/06/2021 NEG   Final   Sample Expiration 07/06/2021 07/20/2021,2359   Final   Extend sample reason 07/06/2021    Final                   Value:NO TRANSFUSIONS OR PREGNANCY IN THE PAST 3 MONTHS Performed at [place] [address], [place],  [address]      X-Rays:DG Pelvis Portable  Result Date: 07/18/2021 CLINICAL DATA:  Status post left hip replacement. EXAM: PORTABLE PELVIS 1-2 VIEWS COMPARISON:  Fluoroscopic images of same day. FINDINGS: The left femoral and acetabular components are well situated. Expected fluoroscopic images are seen in the surrounding soft tissues. IMPRESSION: Status post left total hip arthroplasty. Electronically Signed   By: Marijo Conception M.D.   On: 07/18/2021 12:58   DG C-Arm 1-60 Min-No Report  Result  Date: 07/18/2021 Fluoroscopy was utilized by the requesting physician.  No radiographic interpretation.   DG HIP OPERATIVE UNILAT W OR W/O PELVIS LEFT  Result Date: 07/18/2021 CLINICAL DATA:  Status post left anterior hip replacement. EXAM: OPERATIVE LEFT HIP (WITH PELVIS  IF PERFORMED) 2 VIEWS TECHNIQUE: Fluoroscopic spot image(Anne) were submitted for interpretation post-operatively. COMPARISON:  None. FINDINGS: Fluoro time: 11 seconds. Two C-arm fluoroscopic images were obtained intraoperatively and submitted for post operative interpretation. These images demonstrate postsurgical changes of left total hip arthroplasty. No unexpected findings. Previous right total hip arthroplasty, partially imaged. Please see the performing provider'Anne procedural report for further detail. IMPRESSION: Intraoperative fluoroscopy during left total hip replacement. Electronically Signed   By: Margaretha Sheffield M.D.   On: 07/18/2021 11:42    EKG: Orders placed or performed during the hospital encounter of 07/06/21   EKG 12 lead per protocol   EKG 12 lead per protocol     Hospital Course: Anne Pearson is a 80 y.o. who was admitted to [facility]. They were brought to the operating room on 07/18/2021 and underwent Procedure(Anne): [place].  Patient tolerated the procedure well and was later transferred to the recovery room and then to the orthopaedic floor for postoperative care. They were given PO and IV analgesics for pain control following their surgery. They were given 24 hours of postoperative antibiotics of  Anti-infectives (From admission, onward)    Start     Dose/Rate Route Frequency Ordered Stop   07/18/21 1600  ceFAZolin (ANCEF) IVPB 2g/100 mL premix        2 g 200 mL/hr over 30 Minutes Intravenous Every 6 hours 07/18/21 1322 07/18/21 2249   07/18/21 0745  ceFAZolin (ANCEF) IVPB 2g/100 mL premix        2 g 200 mL/hr over 30 Minutes Intravenous On call to O.R. 07/18/21  [id number] 07/18/21 1033      and started on DVT prophylaxis in the form of Aspirin.   PT and OT were ordered for total joint protocol. Discharge planning consulted to help with postop disposition and equipment needs.  Patient had a good night on the evening of surgery. They started to get up OOB with therapy on POD #0. Pt was seen during rounds and was ready to go home pending progress with therapy. she worked with therapy on POD #1 and was meeting her goals. Pt was discharged to home later that day in stable condition.  Diet: Regular diet Activity: WBAT Follow-up: in 2 weeks Disposition: Home with HEP Discharged Condition: stable   Discharge Instructions     Call MD / Call 911   Complete by: As directed    If you experience chest pain or shortness of breath, CALL 911 and be transported to the hospital emergency room.  If you develope a fever above 101 F, pus (white drainage) or increased drainage or redness at the wound, or calf pain, call your surgeon'Anne office.   Change dressing   Complete by: As directed    You have an adhesive waterproof bandage over the incision. Leave this in place until your first follow-up appointment. Once you remove this you will not need to place another bandage.   Constipation Prevention   Complete by: As directed    Drink plenty of fluids.  Prune juice may be helpful.  You may use a stool softener, such as Colace (over the counter) 100 mg twice a day.  Use MiraLax (over the counter) for constipation as needed.   Diet - low sodium heart healthy   Complete by: As directed    Do not sit on low chairs, stoools or toilet seats, as it may be difficult to get up from low surfaces   Complete by: As  directed    Driving restrictions   Complete by: As directed    No driving for two weeks   Post-operative opioid taper instructions:   Complete by: As directed    POST-OPERATIVE OPIOID TAPER INSTRUCTIONS: It is important to wean off of your opioid medication as soon as  possible. If you do not need pain medication after your surgery it is ok to stop day one. Opioids include: Codeine, Hydrocodone(Norco, Vicodin), Oxycodone(Percocet, oxycontin) and hydromorphone amongst others.  Long term and even short term use of opiods can cause: Increased pain response Dependence Constipation Depression Respiratory depression And more.  Withdrawal symptoms can include Flu like symptoms Nausea, vomiting And more Techniques to manage these symptoms Hydrate well Eat regular healthy meals Stay active Use relaxation techniques(deep breathing, meditating, yoga) Do Not substitute Alcohol to help with tapering If you have been on opioids for less than two weeks and do not have pain than it is ok to stop all together.  Plan to wean off of opioids This plan should start within one week post op of your joint replacement. Maintain the same interval or time between taking each dose and first decrease the dose.  Cut the total daily intake of opioids by one tablet each day Next start to increase the time between doses. The last dose that should be eliminated is the evening dose.      TED hose   Complete by: As directed    Use stockings (TED hose) for three weeks on both leg(Anne).  You may remove them at night for sleeping.   Weight bearing as tolerated   Complete by: As directed       Allergies as of 07/19/2021       Reactions   Macrodantin [nitrofurantoin] Other (See Comments)   Caused hepatitis    Codeine Nausea And Vomiting   Other    Red meat - Alpha GAL   Meloxicam Rash        Medication List     TAKE these medications    ALPRAZolam 1 MG tablet Commonly known as: XANAX Take 1 mg by mouth at bedtime.   aspirin 325 MG EC tablet Take 1 tablet (325 mg total) by mouth 2 (two) times daily for 20 days. Then take one 81 mg aspirin once a day for three weeks. Then discontinue aspirin.   cholestyramine 4 g packet Commonly known as: QUESTRAN Take 4 g by  mouth daily as needed (Colitis).   famotidine 20 MG tablet Commonly known as: PEPCID Take 20 mg by mouth daily.   fluticasone 50 MCG/ACT nasal spray Commonly known as: FLONASE Place 2 sprays into both nostrils at bedtime.   HYDROcodone-acetaminophen 5-325 MG tablet Commonly known as: NORCO/VICODIN Take 1-2 tablets by mouth every 6 (six) hours as needed for severe pain (pain score 4-6).   lansoprazole 15 MG capsule Commonly known as: PREVACID Take 7.5 mg by mouth every evening.   levothyroxine 112 MCG tablet Commonly known as: SYNTHROID Take 112 mcg by mouth daily before breakfast.   lisinopril 10 MG tablet Commonly known as: ZESTRIL Take 10 mg by mouth daily.   loratadine 10 MG tablet Commonly known as: CLARITIN Take 10 mg by mouth daily. [employer], operational   methocarbamol 500 MG tablet Commonly known as: ROBAXIN Take 1 tablet (500 mg total) by mouth every 6 (six) hours as needed for muscle spasms.   metoprolol tartrate 25 MG tablet Commonly known as: LOPRESSOR Take 25 mg by mouth daily.  metroNIDAZOLE 0.75 % cream Commonly known as: METROCREAM Apply 1 application topically daily as needed. Applied to nose & under eyes   pramipexole 0.25 MG tablet Commonly known as: MIRAPEX Take 0.5 mg by mouth at bedtime.   rosuvastatin 10 MG tablet Commonly known as: CRESTOR Take 10 mg by mouth at bedtime.   traMADol 50 MG tablet Commonly known as: ULTRAM Take 1-2 tablets (50-100 mg total) by mouth every 6 (six) hours as needed for moderate pain.   vitamin C 500 MG tablet Commonly known as: ASCORBIC ACID Take 500 mg by mouth daily.   Vitamin D 125 MCG (5000 UT) Caps Take 5,000 Units by mouth daily.               Discharge Care Instructions  (From admission, onward)           Start     Ordered   07/19/21 0000  Weight bearing as tolerated        07/19/21 0725   07/19/21 0000  Change dressing       Comments: You have an adhesive waterproof  bandage over the incision. Leave this in place until your first follow-up appointment. Once you remove this you will not need to place another bandage.   07/19/21 0725            Follow-up Information     Gaynelle Arabian, MD Follow up in 2 week(Anne).   Specialty: Orthopedic Surgery Contact information: [address] [place] [address] [phone]                 Signed: Theresa Duty, PA-C Orthopedic Surgery 07/30/2021, 7:45 AM

## 2022-08-01 IMAGING — DX DG PORTABLE PELVIS
1 series · 1 of 1 positions shown · non-contrast
Comparison: Fluoroscopic images of same day.

CLINICAL DATA: Status post left hip replacement.

EXAM:
PORTABLE PELVIS 1-2 VIEWS

[pelvis ap]
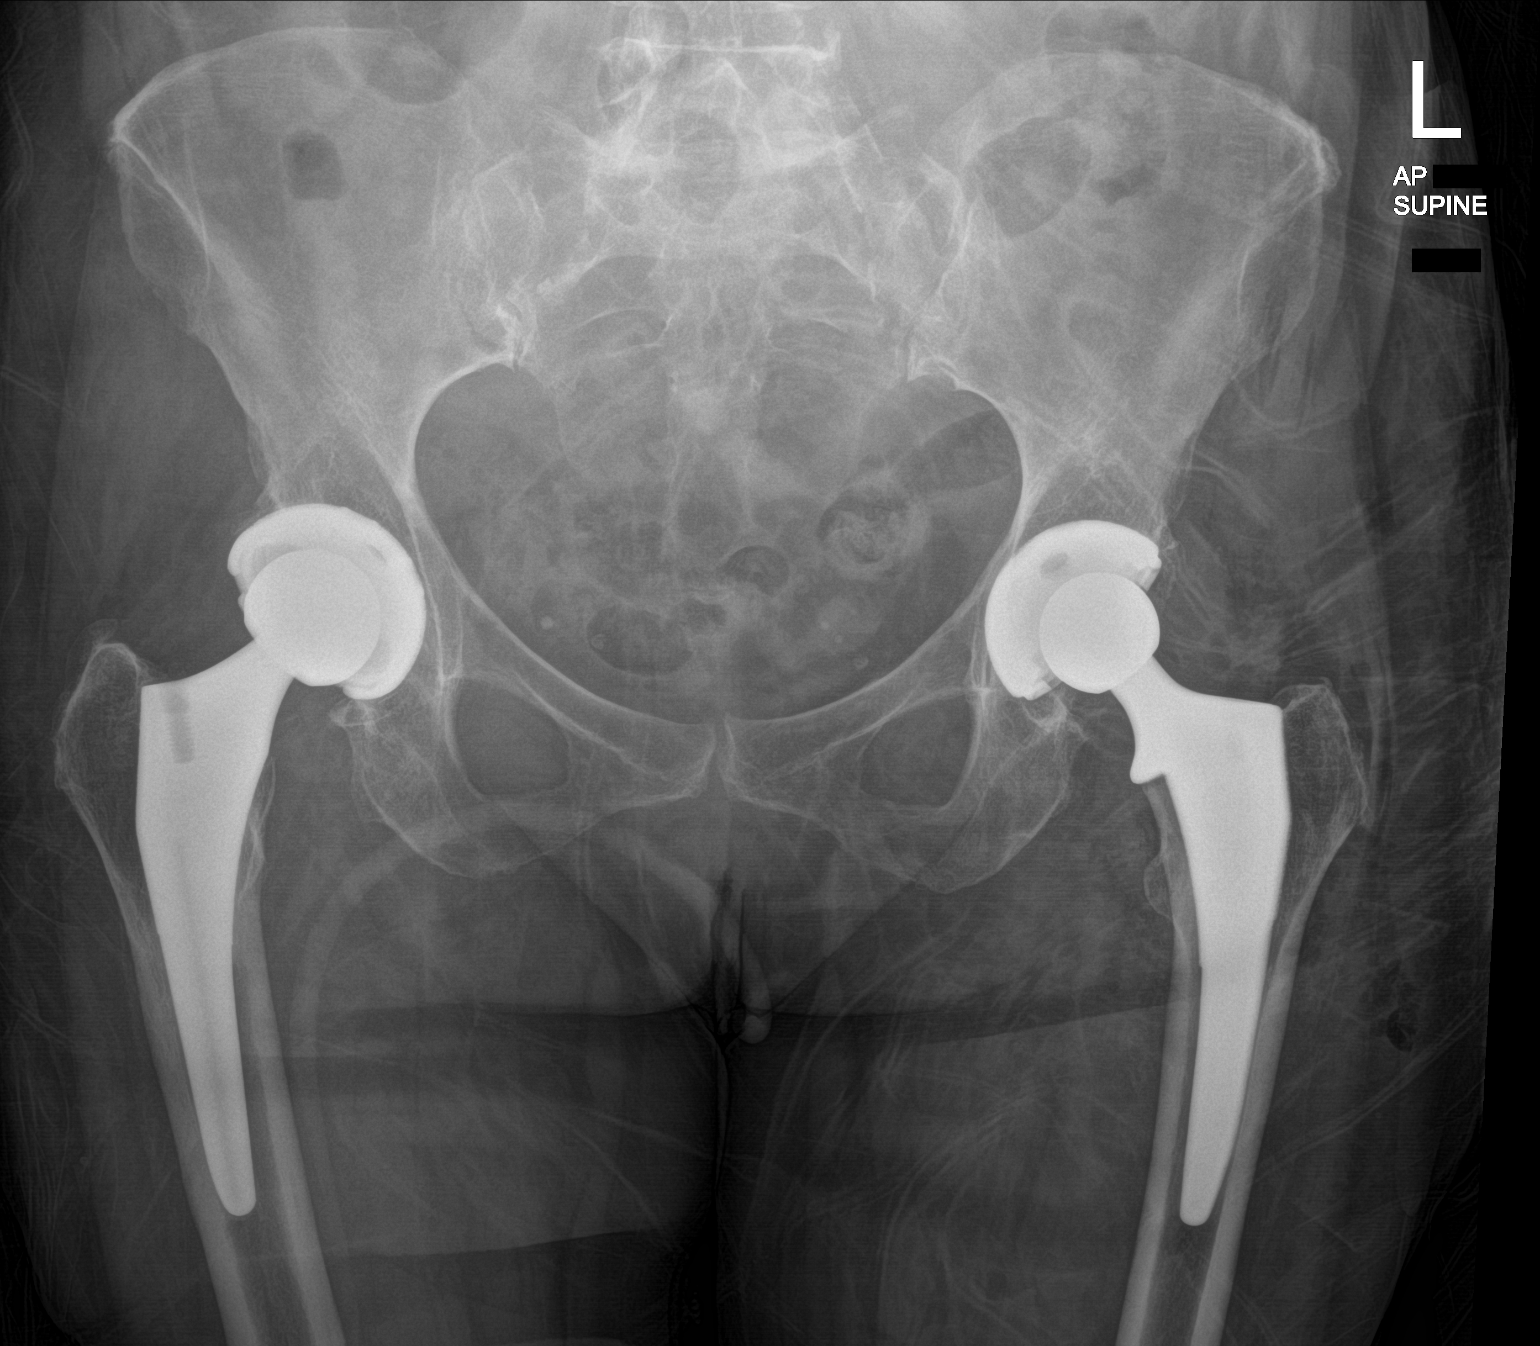

[1 of 1 positions shown; findings below may reference images not displayed]

FINDINGS: The left femoral and acetabular components are well situated.
Expected fluoroscopic images are seen in the surrounding soft
tissues.
IMPRESSION: Status post left total hip arthroplasty.

## 2022-10-02 ENCOUNTER — Other Ambulatory Visit (HOSPITAL_COMMUNITY): Payer: Self-pay | Admitting: Family Medicine

## 2022-10-02 DIAGNOSIS — R1033 Periumbilical pain: Secondary | ICD-10-CM

## 2022-10-17 ENCOUNTER — Ambulatory Visit (HOSPITAL_COMMUNITY)
Admission: RE | Admit: 2022-10-17 | Discharge: 2022-10-17 | Disposition: A | Discharge status: Home / Self Care | Payer: Medicare Other | Source: Ambulatory Visit | Attending: Family Medicine | Admitting: Family Medicine

## 2022-10-17 DIAGNOSIS — R1033 Periumbilical pain: Secondary | ICD-10-CM | POA: Diagnosis present

## 2022-10-17 LAB — POCT I-STAT CREATININE: Creatinine, Ser: 0.6 mg/dL (ref 0.44–1.00)

## 2022-10-17 MED ORDER — IOHEXOL 350 MG/ML SOLN
75.0000 mL | Freq: Once | INTRAVENOUS | Status: AC | PRN
Start: 2022-10-17 — End: 2022-10-17
  Administered 2022-10-17: 75 mL via INTRAVENOUS

## 2022-10-17 MED ORDER — IOHEXOL 9 MG/ML PO SOLN: 500.0000 mL | ORAL | Status: AC
